# Patient Record
Sex: Male | Born: 1935 | Race: White | Hispanic: No | Marital: Married | Smoking: Former smoker
Health system: Southern US, Community
[De-identification: ages and names within clinical notes are randomized; demographics above are authoritative.]

## PROBLEM LIST (undated history)

## (undated) DIAGNOSIS — F068 Other specified mental disorders due to known physiological condition: Secondary | ICD-10-CM

## (undated) DIAGNOSIS — R296 Repeated falls: Secondary | ICD-10-CM

## (undated) DIAGNOSIS — G309 Alzheimer's disease, unspecified: Secondary | ICD-10-CM

## (undated) DIAGNOSIS — Z87448 Personal history of other diseases of urinary system: Secondary | ICD-10-CM

## (undated) DIAGNOSIS — J449 Chronic obstructive pulmonary disease, unspecified: Secondary | ICD-10-CM

## (undated) DIAGNOSIS — J40 Bronchitis, not specified as acute or chronic: Secondary | ICD-10-CM

## (undated) DIAGNOSIS — F329 Major depressive disorder, single episode, unspecified: Secondary | ICD-10-CM

## (undated) DIAGNOSIS — F1021 Alcohol dependence, in remission: Secondary | ICD-10-CM

## (undated) DIAGNOSIS — I129 Hypertensive chronic kidney disease with stage 1 through stage 4 chronic kidney disease, or unspecified chronic kidney disease: Secondary | ICD-10-CM

## (undated) DIAGNOSIS — I10 Essential (primary) hypertension: Secondary | ICD-10-CM

## (undated) DIAGNOSIS — E785 Hyperlipidemia, unspecified: Secondary | ICD-10-CM

## (undated) DIAGNOSIS — F17200 Nicotine dependence, unspecified, uncomplicated: Secondary | ICD-10-CM

## (undated) DIAGNOSIS — K469 Unspecified abdominal hernia without obstruction or gangrene: Secondary | ICD-10-CM

## (undated) DIAGNOSIS — F0280 Dementia in other diseases classified elsewhere without behavioral disturbance: Secondary | ICD-10-CM

## (undated) DIAGNOSIS — M545 Low back pain: Secondary | ICD-10-CM

## (undated) HISTORY — DX: Bronchitis, not specified as acute or chronic: J40

## (undated) HISTORY — DX: Major depressive disorder, single episode, unspecified: F32.9

## (undated) HISTORY — DX: Dementia in other diseases classified elsewhere without behavioral disturbance: F02.80

## (undated) HISTORY — DX: Alcohol dependence, in remission: F10.21

## (undated) HISTORY — DX: Hypertensive chronic kidney disease with stage 1 through stage 4 chronic kidney disease, or unspecified chronic kidney disease: I12.9

## (undated) HISTORY — PX: PILONIDAL CYST EXCISION: SHX744

## (undated) HISTORY — DX: Other specified mental disorders due to known physiological condition: F06.8

## (undated) HISTORY — DX: Personal history of other diseases of urinary system: Z87.448

## (undated) HISTORY — DX: Alzheimer's disease, unspecified: G30.9

## (undated) HISTORY — DX: Hyperlipidemia, unspecified: E78.5

## (undated) HISTORY — DX: Essential (primary) hypertension: I10

## (undated) HISTORY — DX: Nicotine dependence, unspecified, uncomplicated: F17.200

## (undated) HISTORY — DX: Unspecified abdominal hernia without obstruction or gangrene: K46.9

## (undated) HISTORY — DX: Low back pain: M54.5

## (undated) HISTORY — PX: GLAUCOMA REPAIR: SHX214

## (undated) HISTORY — DX: Chronic obstructive pulmonary disease, unspecified: J44.9

---

## 1998-08-01 ENCOUNTER — Emergency Department (HOSPITAL_COMMUNITY): Admission: EM | Admit: 1998-08-01 | Discharge: 1998-08-01 | Payer: Self-pay | Admitting: Emergency Medicine

## 2001-05-01 ENCOUNTER — Encounter: Payer: Self-pay | Admitting: Internal Medicine

## 2001-05-15 ENCOUNTER — Encounter: Payer: Self-pay | Admitting: Vascular Surgery

## 2001-05-19 ENCOUNTER — Ambulatory Visit (HOSPITAL_COMMUNITY): Admission: RE | Admit: 2001-05-19 | Discharge: 2001-05-19 | Payer: Self-pay | Admitting: Vascular Surgery

## 2001-05-21 ENCOUNTER — Ambulatory Visit (HOSPITAL_COMMUNITY): Admission: RE | Admit: 2001-05-21 | Discharge: 2001-05-21 | Payer: Self-pay | Admitting: Vascular Surgery

## 2001-05-21 ENCOUNTER — Encounter: Payer: Self-pay | Admitting: Vascular Surgery

## 2001-06-11 HISTORY — PX: ABDOMINAL AORTIC ANEURYSM REPAIR: SUR1152

## 2001-07-01 ENCOUNTER — Inpatient Hospital Stay (HOSPITAL_COMMUNITY): Admission: RE | Admit: 2001-07-01 | Discharge: 2001-07-03 | Payer: Self-pay | Admitting: Vascular Surgery

## 2001-07-01 ENCOUNTER — Encounter: Payer: Self-pay | Admitting: Vascular Surgery

## 2001-08-13 ENCOUNTER — Encounter: Payer: Self-pay | Admitting: Vascular Surgery

## 2001-08-13 ENCOUNTER — Encounter: Admission: RE | Admit: 2001-08-13 | Discharge: 2001-08-13 | Payer: Self-pay | Admitting: Vascular Surgery

## 2001-11-12 ENCOUNTER — Encounter: Payer: Self-pay | Admitting: Vascular Surgery

## 2001-11-12 ENCOUNTER — Encounter: Admission: RE | Admit: 2001-11-12 | Discharge: 2001-11-12 | Payer: Self-pay | Admitting: Vascular Surgery

## 2002-05-21 ENCOUNTER — Encounter: Payer: Self-pay | Admitting: Vascular Surgery

## 2002-05-21 ENCOUNTER — Encounter: Admission: RE | Admit: 2002-05-21 | Discharge: 2002-05-21 | Payer: Self-pay | Admitting: Vascular Surgery

## 2003-06-23 ENCOUNTER — Encounter: Admission: RE | Admit: 2003-06-23 | Discharge: 2003-06-23 | Payer: Self-pay | Admitting: Vascular Surgery

## 2003-09-01 ENCOUNTER — Encounter: Payer: Self-pay | Admitting: Internal Medicine

## 2004-07-12 ENCOUNTER — Encounter: Admission: RE | Admit: 2004-07-12 | Discharge: 2004-07-12 | Payer: Self-pay | Admitting: Vascular Surgery

## 2004-07-27 ENCOUNTER — Ambulatory Visit: Payer: Self-pay | Admitting: Internal Medicine

## 2004-08-03 ENCOUNTER — Ambulatory Visit: Payer: Self-pay | Admitting: Internal Medicine

## 2004-10-11 ENCOUNTER — Ambulatory Visit: Payer: Self-pay | Admitting: Gastroenterology

## 2004-11-30 ENCOUNTER — Ambulatory Visit: Payer: Self-pay | Admitting: Gastroenterology

## 2005-02-07 ENCOUNTER — Encounter: Admission: RE | Admit: 2005-02-07 | Discharge: 2005-02-07 | Payer: Self-pay | Admitting: Vascular Surgery

## 2005-05-22 ENCOUNTER — Ambulatory Visit: Payer: Self-pay | Admitting: Internal Medicine

## 2005-06-11 HISTORY — PX: OTHER SURGICAL HISTORY: SHX169

## 2005-08-22 ENCOUNTER — Encounter: Admission: RE | Admit: 2005-08-22 | Discharge: 2005-08-22 | Payer: Self-pay | Admitting: Vascular Surgery

## 2005-09-04 ENCOUNTER — Emergency Department (HOSPITAL_COMMUNITY): Admission: EM | Admit: 2005-09-04 | Discharge: 2005-09-04 | Payer: Self-pay | Admitting: Emergency Medicine

## 2005-09-05 ENCOUNTER — Ambulatory Visit: Payer: Self-pay | Admitting: Internal Medicine

## 2005-09-06 ENCOUNTER — Ambulatory Visit: Payer: Self-pay

## 2005-09-06 ENCOUNTER — Encounter: Payer: Self-pay | Admitting: Internal Medicine

## 2005-09-19 ENCOUNTER — Inpatient Hospital Stay (HOSPITAL_COMMUNITY): Admission: AD | Admit: 2005-09-19 | Discharge: 2005-09-22 | Payer: Self-pay | Admitting: Internal Medicine

## 2005-09-19 ENCOUNTER — Ambulatory Visit: Payer: Self-pay | Admitting: Internal Medicine

## 2005-10-02 ENCOUNTER — Inpatient Hospital Stay (HOSPITAL_COMMUNITY): Admission: RE | Admit: 2005-10-02 | Discharge: 2005-10-10 | Payer: Self-pay | Admitting: Vascular Surgery

## 2005-10-02 ENCOUNTER — Encounter (INDEPENDENT_AMBULATORY_CARE_PROVIDER_SITE_OTHER): Payer: Self-pay | Admitting: *Deleted

## 2005-10-13 ENCOUNTER — Emergency Department (HOSPITAL_COMMUNITY): Admission: EM | Admit: 2005-10-13 | Discharge: 2005-10-13 | Payer: Self-pay | Admitting: Emergency Medicine

## 2005-10-16 ENCOUNTER — Ambulatory Visit: Payer: Self-pay | Admitting: Internal Medicine

## 2005-11-28 ENCOUNTER — Ambulatory Visit: Payer: Self-pay | Admitting: Internal Medicine

## 2006-01-08 ENCOUNTER — Ambulatory Visit: Payer: Self-pay | Admitting: Internal Medicine

## 2006-01-25 ENCOUNTER — Ambulatory Visit: Payer: Self-pay | Admitting: Internal Medicine

## 2006-01-25 ENCOUNTER — Emergency Department (HOSPITAL_COMMUNITY): Admission: EM | Admit: 2006-01-25 | Discharge: 2006-01-26 | Payer: Self-pay | Admitting: Emergency Medicine

## 2006-01-26 ENCOUNTER — Inpatient Hospital Stay (HOSPITAL_COMMUNITY): Admission: EM | Admit: 2006-01-26 | Discharge: 2006-02-02 | Payer: Self-pay | Admitting: *Deleted

## 2006-01-26 ENCOUNTER — Ambulatory Visit: Payer: Self-pay | Admitting: Psychiatry

## 2006-02-04 ENCOUNTER — Ambulatory Visit: Payer: Self-pay | Admitting: Internal Medicine

## 2006-02-14 ENCOUNTER — Ambulatory Visit: Payer: Self-pay | Admitting: Internal Medicine

## 2006-03-06 ENCOUNTER — Encounter: Payer: Self-pay | Admitting: Internal Medicine

## 2006-05-14 ENCOUNTER — Inpatient Hospital Stay (HOSPITAL_COMMUNITY): Admission: AD | Admit: 2006-05-14 | Discharge: 2006-05-21 | Payer: Self-pay | Admitting: Psychiatry

## 2006-05-14 ENCOUNTER — Emergency Department (HOSPITAL_COMMUNITY): Admission: EM | Admit: 2006-05-14 | Discharge: 2006-05-14 | Payer: Self-pay | Admitting: Emergency Medicine

## 2006-05-14 ENCOUNTER — Encounter: Payer: Self-pay | Admitting: Internal Medicine

## 2006-05-14 ENCOUNTER — Ambulatory Visit: Payer: Self-pay | Admitting: Psychiatry

## 2006-05-30 ENCOUNTER — Ambulatory Visit: Payer: Self-pay | Admitting: Internal Medicine

## 2006-05-30 LAB — CONVERTED CEMR LAB
ALT: 38 units/L (ref 0–40)
AST: 22 units/L (ref 0–37)
Alkaline Phosphatase: 75 units/L (ref 39–117)
Total Bilirubin: 0.5 mg/dL (ref 0.3–1.2)
Total Protein: 6.6 g/dL (ref 6.0–8.3)

## 2006-07-29 ENCOUNTER — Ambulatory Visit: Payer: Self-pay | Admitting: Internal Medicine

## 2006-09-17 IMAGING — CR DG CHEST 2V
2 series · 2 of 2 positions shown · non-contrast
Comparison: none

CLINICAL DATA: Prestent placement. 
 CHEST - 2 VIEW ? 09/19/05:
 Two views of the chest show the lungs to be clear but hyperaerated suggesting COPD.  The heart is within normal limits in size.  No acute bony abnormality is seen.

[view not recorded (1 of 2)]
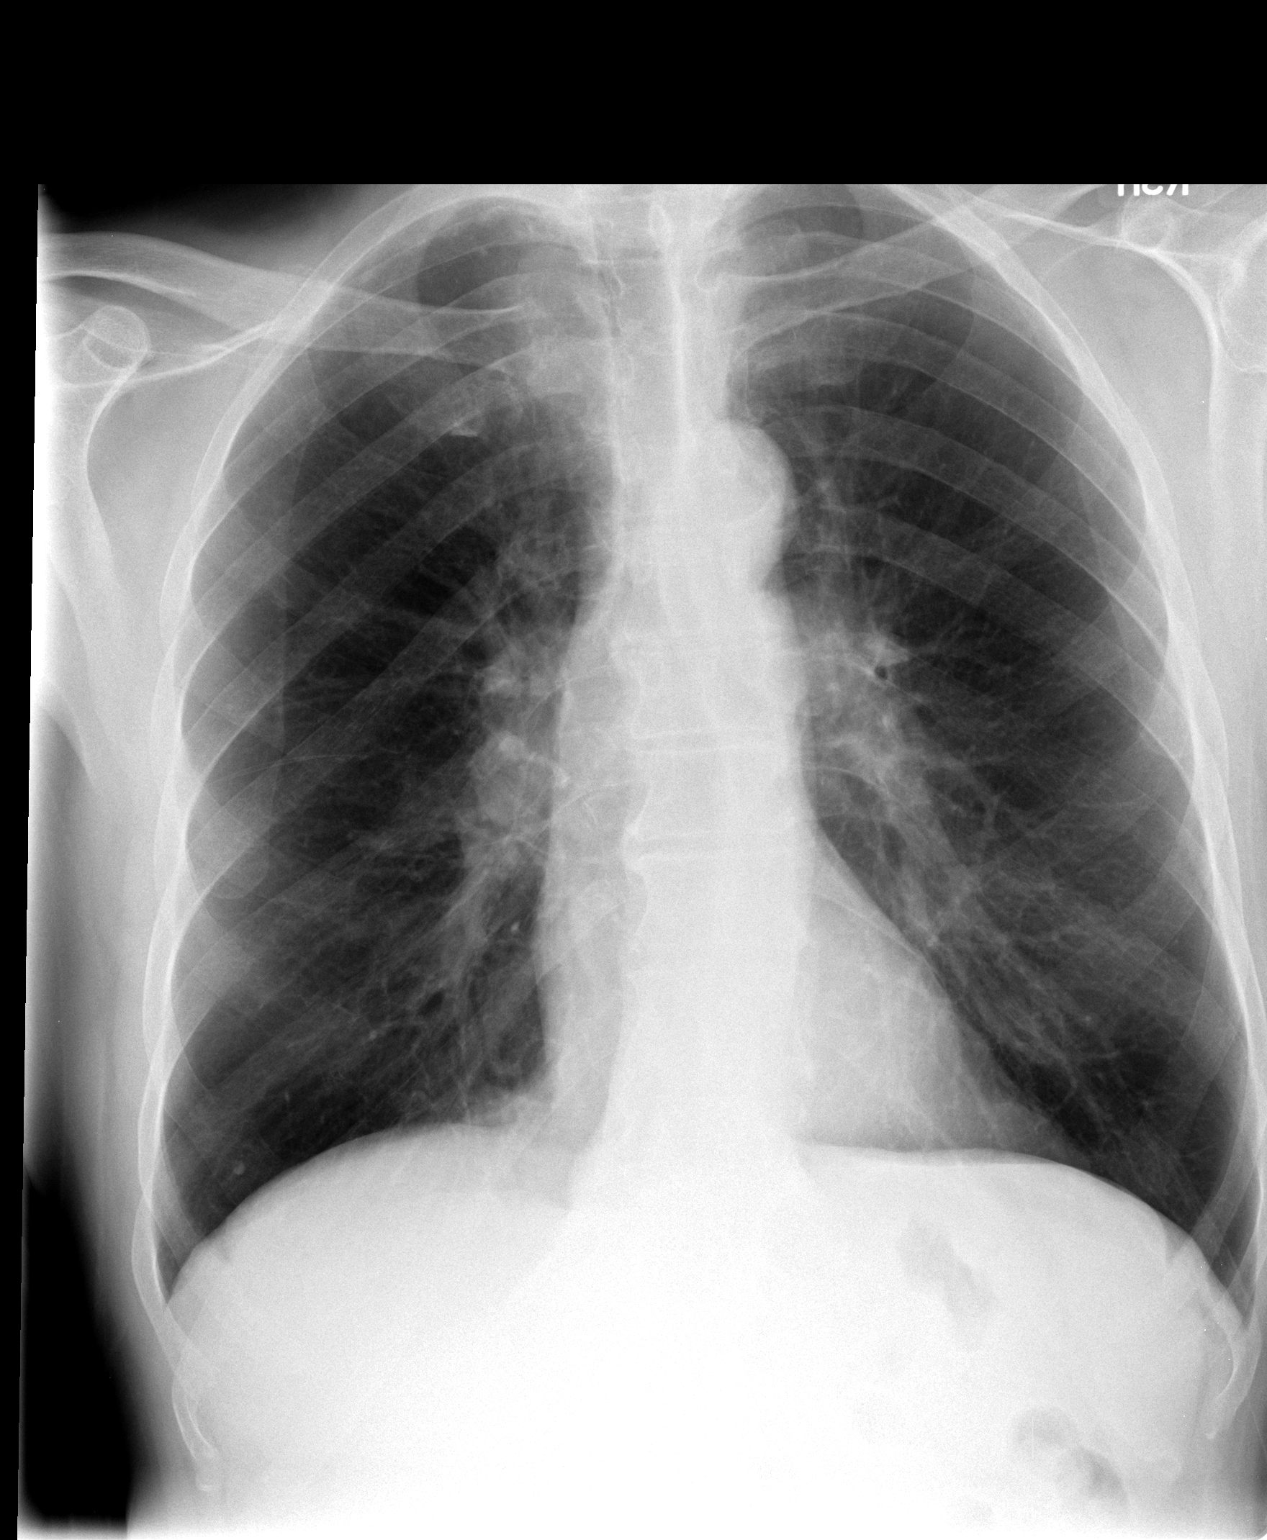

[view not recorded (2 of 2)]
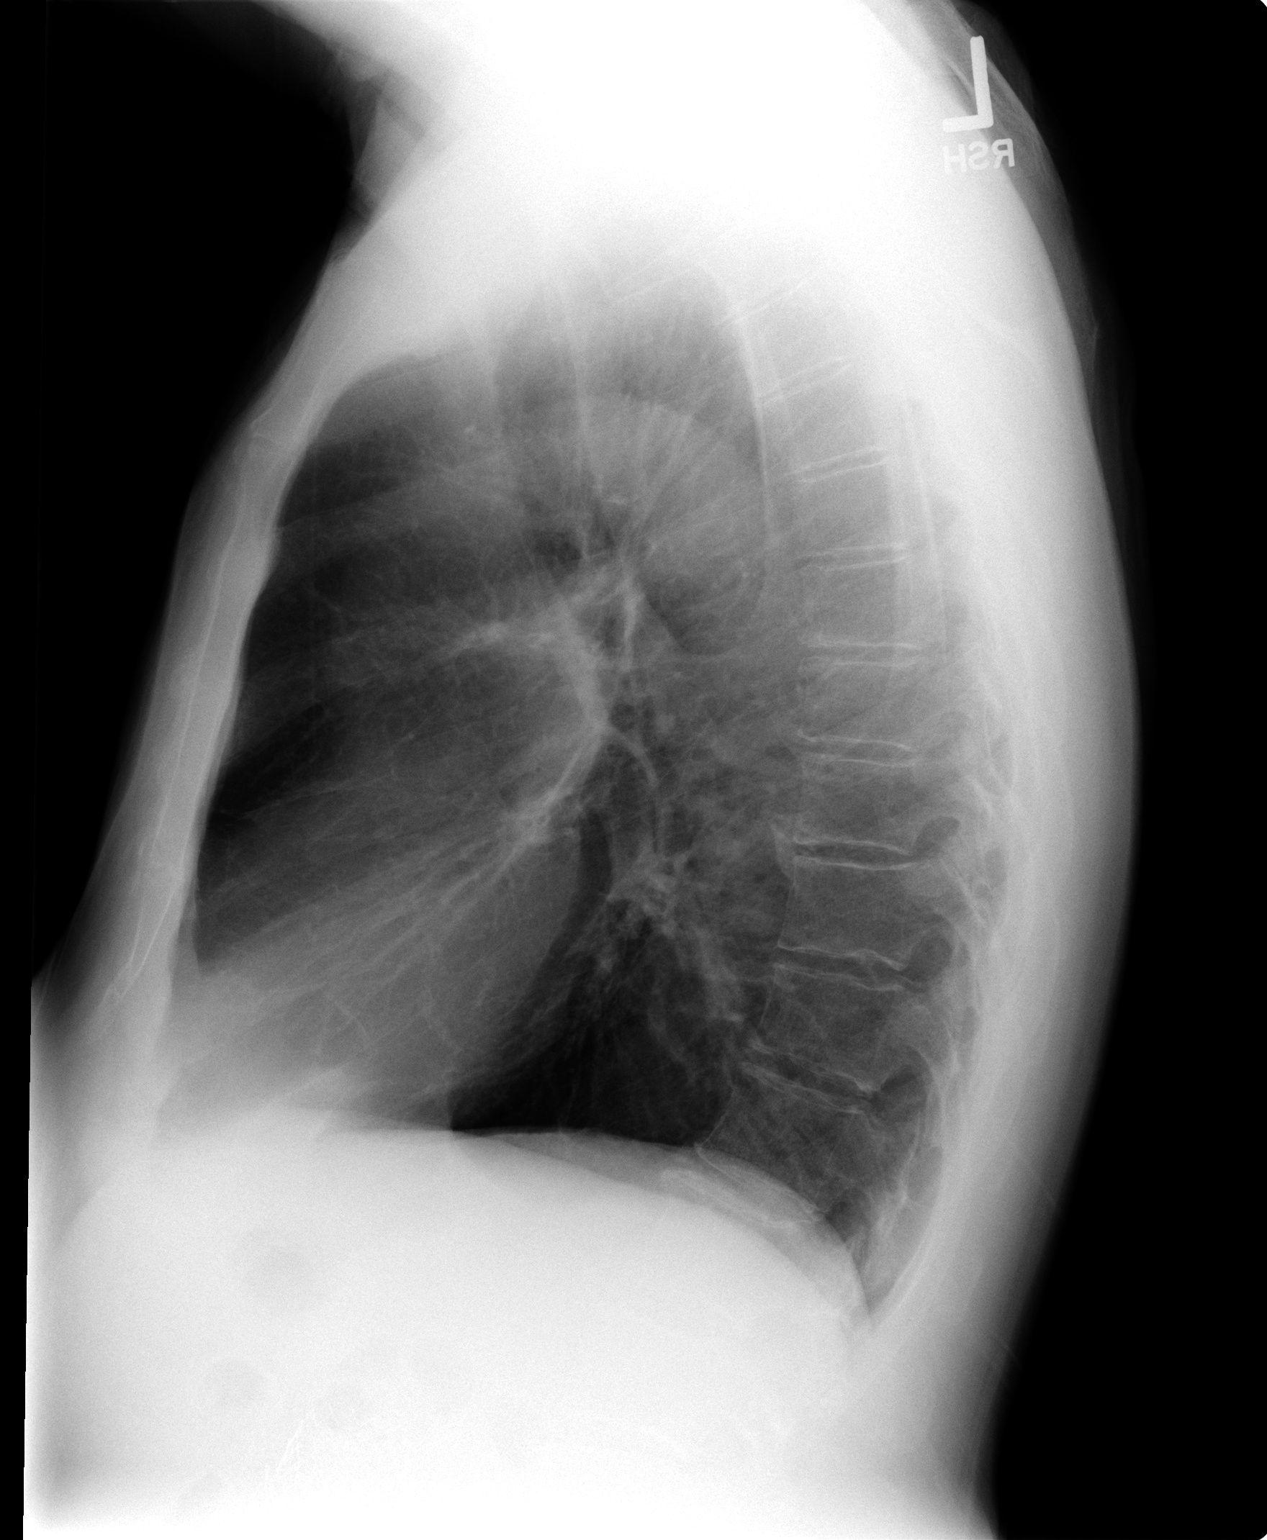

[2 of 2 positions shown; findings below may reference images not displayed]

IMPRESSION: No active lung disease. Probable COPD.

## 2006-09-30 IMAGING — CR DG CHEST 1V PORT
1 series · 1 of 1 positions shown · non-contrast
Comparison: none

CLINICAL DATA: Status post abdominal aortic aneurysm repair.  Swan-Ganz catheter line placement.  
 PORTABLE CHEST -  SINGLE VIEW - 10/02/05:

[view not recorded]
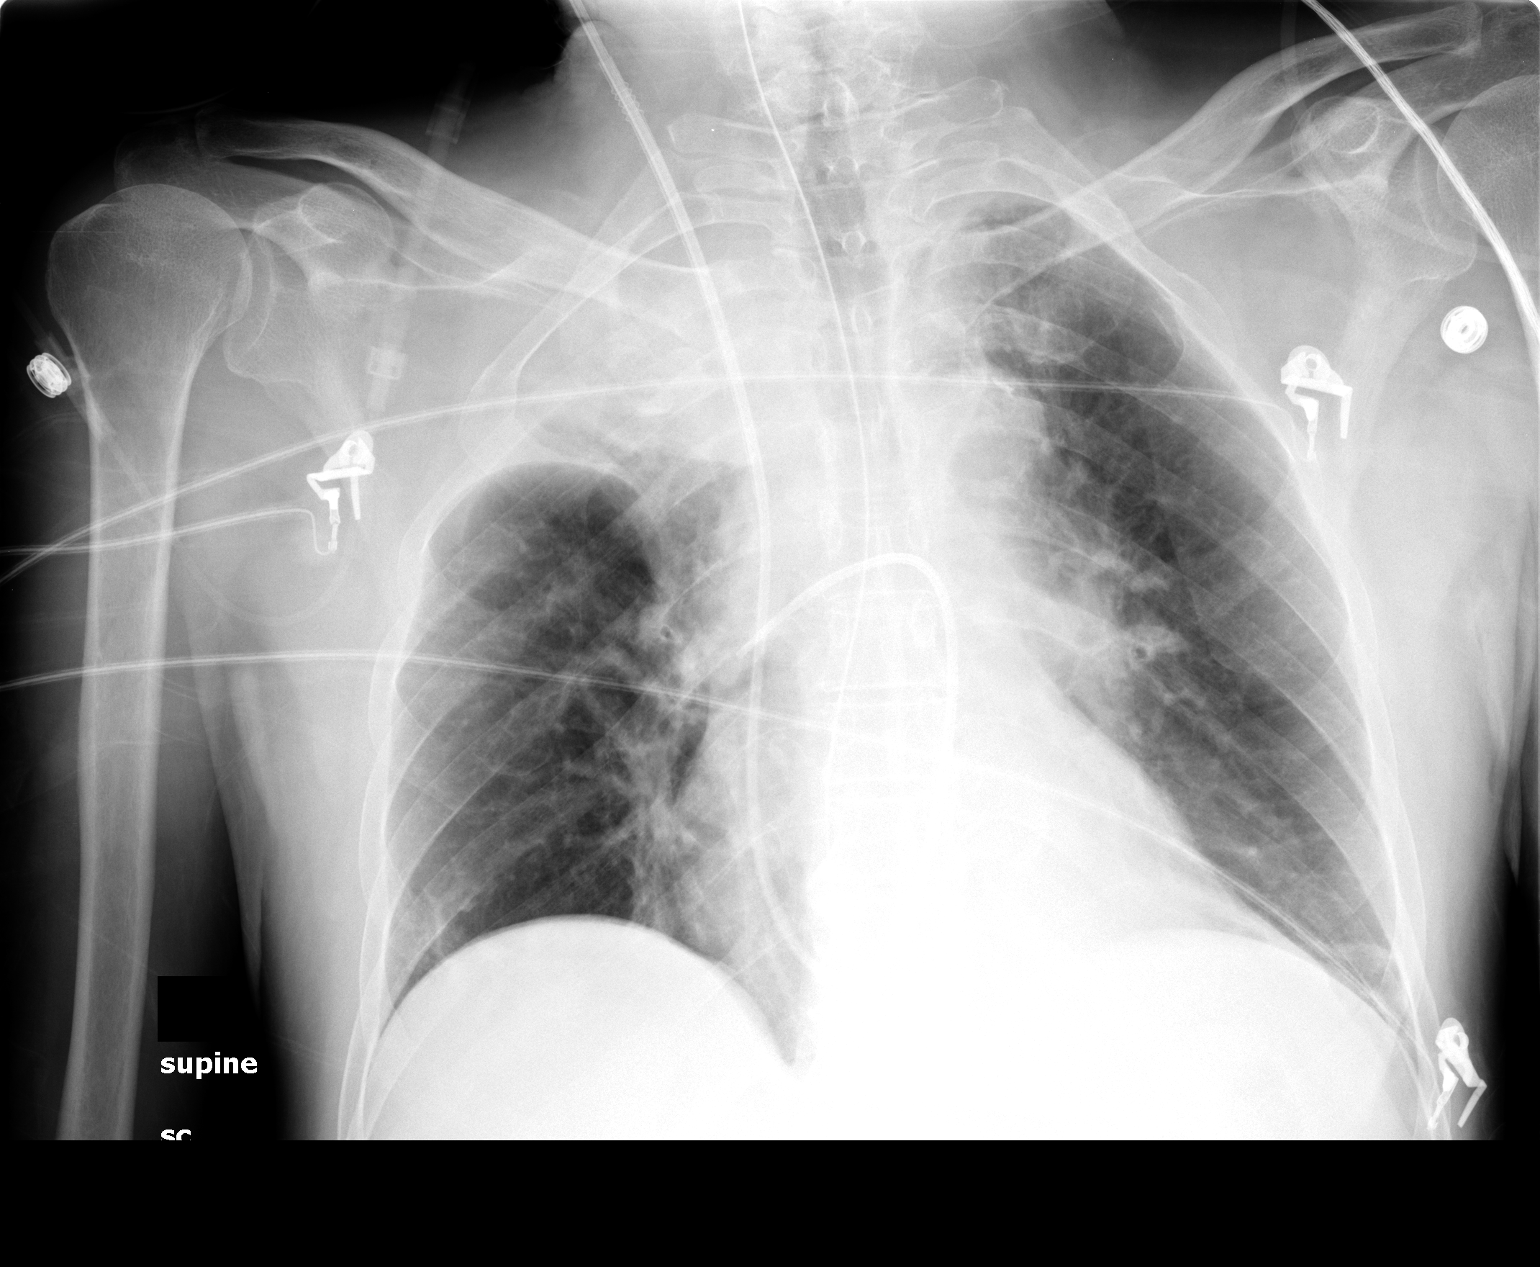

[1 of 1 positions shown; findings below may reference images not displayed]

FINDINGS: An AP supine portable film of the chest made on 10/02/05 at 2575 hours is compared with the preoperative study of 09/19/05 and now shows a right jugular Swan-Ganz catheter to have been inserted.  The catheter appears to be in good position with the tip in the right main pulmonary artery at the junction of the descending right pulmonary artery.  There is no pneumothorax but there does appear to be areas of atelectasis and possibly hemorrhage associated with the right lung apex.  No free fluid is seen in the pleural space.
 The nasogastric tube tip is in the fundus of the stomach.  There is some hypoaeration of the left base.
IMPRESSION: Right Swan-Ganz catheter tip is in the right main pulmonary artery near the bifurcation into the upper and lower right lung pulmonary arteries.
 There is no definite pneumothorax but there is suggestion of atelectasis and/or hemorrhage associated with the region of the right lung apex.
 The nasogastric tube tip is in the fundus of the stomach.

## 2006-10-01 IMAGING — CR DG CHEST 1V PORT
1 series · 1 of 1 positions shown · non-contrast
Comparison: 10/02/2005.

CLINICAL DATA: Abdominal aortic aneurysm.
 PORTABLE CHEST, ONE VIEW ? 10/03/2005:

[view not recorded]
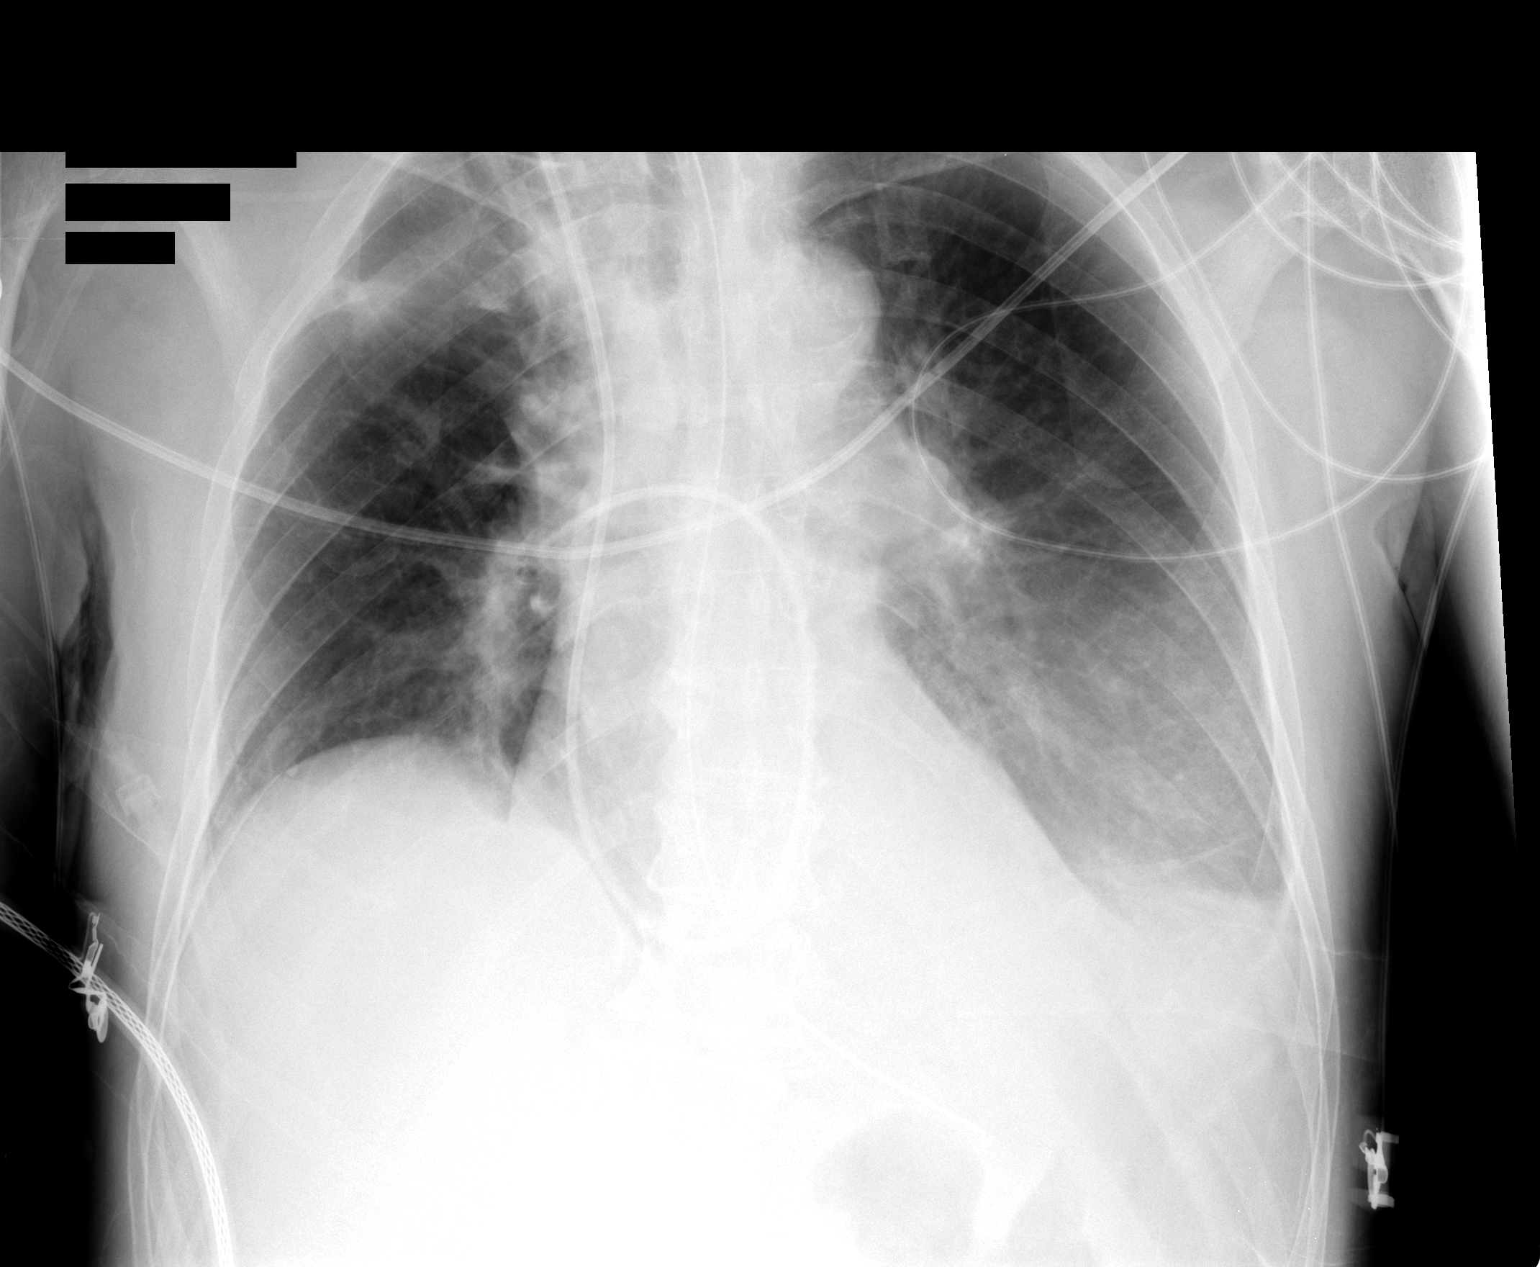

[1 of 1 positions shown; findings below may reference images not displayed]

FINDINGS: There is an NG tube with side-port at the level of the GE junction.
 There is a new left effusion which is layering posteriorly.  Aeration to the right upper lobe is improved in the interval.
 Swan-Ganz catheter tip is within the right pulmonary artery.
IMPRESSION: 1. Improved aeration to the right upper lobe.
 2. New left effusion layering posteriorly.
 3. Stable nasogastric tube and Swan-Ganz catheter.

## 2006-10-11 IMAGING — CR DG CHEST 1V PORT
1 series · 1 of 1 positions shown · non-contrast
Comparison: none

CLINICAL DATA: Short of breath

Portable chest at 8377:
Comparison 10/04/2005. Small left effusion. Patchy left infrahilar and
retrocardiac atelectasis or infiltrate, improved since previous exam. Right lung
clear. Heart size normal. Atheromatous aortic arch.

[view not recorded]
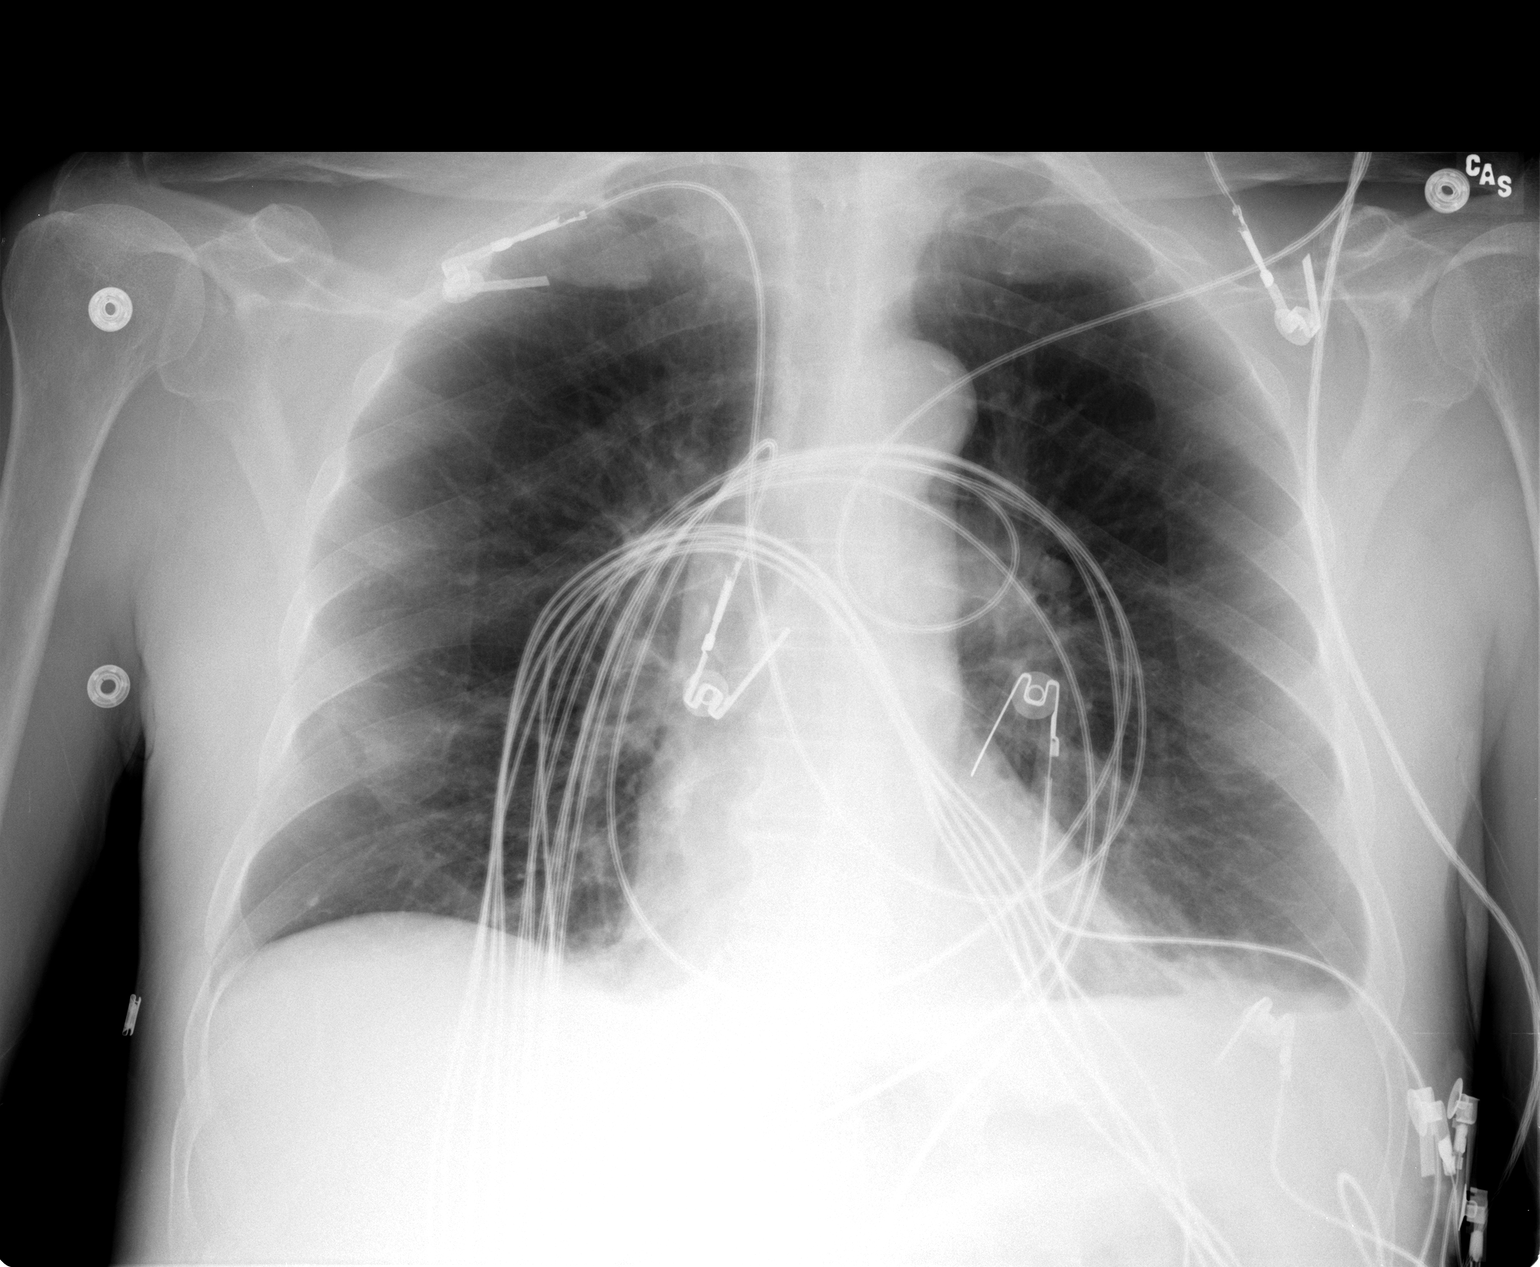

[1 of 1 positions shown; findings below may reference images not displayed]

IMPRESSION: 1. Small left effusion with left lower lung infiltrate or atelectasis, improved
since previous exam

## 2006-10-28 ENCOUNTER — Ambulatory Visit: Payer: Self-pay | Admitting: Internal Medicine

## 2006-11-05 ENCOUNTER — Encounter: Payer: Self-pay | Admitting: Internal Medicine

## 2006-11-05 DIAGNOSIS — F1021 Alcohol dependence, in remission: Secondary | ICD-10-CM

## 2006-11-05 DIAGNOSIS — J449 Chronic obstructive pulmonary disease, unspecified: Secondary | ICD-10-CM

## 2006-11-05 DIAGNOSIS — I10 Essential (primary) hypertension: Secondary | ICD-10-CM

## 2006-11-05 DIAGNOSIS — M545 Low back pain, unspecified: Secondary | ICD-10-CM

## 2006-11-05 DIAGNOSIS — F329 Major depressive disorder, single episode, unspecified: Secondary | ICD-10-CM

## 2006-11-05 DIAGNOSIS — F3289 Other specified depressive episodes: Secondary | ICD-10-CM | POA: Insufficient documentation

## 2006-11-05 DIAGNOSIS — J4489 Other specified chronic obstructive pulmonary disease: Secondary | ICD-10-CM | POA: Insufficient documentation

## 2006-11-05 HISTORY — DX: Major depressive disorder, single episode, unspecified: F32.9

## 2006-11-05 HISTORY — DX: Essential (primary) hypertension: I10

## 2006-11-05 HISTORY — DX: Low back pain, unspecified: M54.50

## 2006-11-05 HISTORY — DX: Other specified depressive episodes: F32.89

## 2006-11-05 HISTORY — DX: Chronic obstructive pulmonary disease, unspecified: J44.9

## 2006-11-05 HISTORY — DX: Alcohol dependence, in remission: F10.21

## 2007-01-09 ENCOUNTER — Telehealth: Payer: Self-pay | Admitting: Internal Medicine

## 2007-01-27 ENCOUNTER — Ambulatory Visit: Payer: Self-pay | Admitting: Internal Medicine

## 2007-01-28 ENCOUNTER — Telehealth: Payer: Self-pay | Admitting: Internal Medicine

## 2007-01-29 ENCOUNTER — Encounter: Payer: Self-pay | Admitting: Internal Medicine

## 2007-03-26 ENCOUNTER — Ambulatory Visit: Payer: Self-pay | Admitting: Internal Medicine

## 2007-03-26 LAB — CONVERTED CEMR LAB
Albumin: 4 g/dL (ref 3.5–5.2)
Alkaline Phosphatase: 83 units/L (ref 39–117)
BUN: 11 mg/dL (ref 6–23)
Basophils Absolute: 0.1 10*3/uL (ref 0.0–0.1)
Calcium: 10.1 mg/dL (ref 8.4–10.5)
Creatinine, Ser: 0.9 mg/dL (ref 0.4–1.5)
GFR calc Af Amer: 107 mL/min
Hemoglobin: 16.4 g/dL (ref 13.0–17.0)
MCHC: 33.6 g/dL (ref 30.0–36.0)
Monocytes Absolute: 0.4 10*3/uL (ref 0.2–0.7)
Monocytes Relative: 7.3 % (ref 3.0–11.0)
Platelets: 258 10*3/uL (ref 150–400)
Potassium: 4.4 meq/L (ref 3.5–5.1)
RDW: 12.3 % (ref 11.5–14.6)
TSH: 1.75 microintl units/mL (ref 0.35–5.50)
Total Bilirubin: 0.7 mg/dL (ref 0.3–1.2)

## 2007-04-15 ENCOUNTER — Telehealth (INDEPENDENT_AMBULATORY_CARE_PROVIDER_SITE_OTHER): Payer: Self-pay | Admitting: *Deleted

## 2007-04-29 ENCOUNTER — Ambulatory Visit: Payer: Self-pay | Admitting: Gastroenterology

## 2007-05-14 ENCOUNTER — Encounter: Payer: Self-pay | Admitting: Gastroenterology

## 2007-05-14 ENCOUNTER — Ambulatory Visit: Payer: Self-pay | Admitting: Gastroenterology

## 2007-06-17 ENCOUNTER — Ambulatory Visit: Payer: Self-pay | Admitting: Gastroenterology

## 2007-06-17 LAB — CONVERTED CEMR LAB
A-1 Antitrypsin, Ser: 189 mg/dL (ref 83–200)
Folate: >20 ng/mL
HCV Ab: NEGATIVE
INR: 1.1 — ABNORMAL HIGH (ref 0.8–1.0)
Vitamin B-12: 303 pg/mL (ref 211–911)

## 2007-06-19 ENCOUNTER — Ambulatory Visit: Payer: Self-pay | Admitting: Internal Medicine

## 2007-06-24 ENCOUNTER — Telehealth: Payer: Self-pay | Admitting: Internal Medicine

## 2007-07-16 ENCOUNTER — Telehealth: Payer: Self-pay | Admitting: Internal Medicine

## 2007-09-26 ENCOUNTER — Ambulatory Visit: Payer: Self-pay | Admitting: Internal Medicine

## 2007-10-06 ENCOUNTER — Telehealth: Payer: Self-pay | Admitting: Internal Medicine

## 2007-10-17 ENCOUNTER — Telehealth: Payer: Self-pay | Admitting: Internal Medicine

## 2007-10-22 ENCOUNTER — Ambulatory Visit: Payer: Self-pay | Admitting: Internal Medicine

## 2007-10-22 DIAGNOSIS — K469 Unspecified abdominal hernia without obstruction or gangrene: Secondary | ICD-10-CM | POA: Insufficient documentation

## 2007-10-22 HISTORY — DX: Unspecified abdominal hernia without obstruction or gangrene: K46.9

## 2007-10-24 ENCOUNTER — Telehealth: Payer: Self-pay | Admitting: Internal Medicine

## 2007-10-29 ENCOUNTER — Ambulatory Visit: Payer: Self-pay | Admitting: Internal Medicine

## 2007-10-30 ENCOUNTER — Telehealth: Payer: Self-pay | Admitting: Internal Medicine

## 2007-10-31 ENCOUNTER — Telehealth: Payer: Self-pay | Admitting: Internal Medicine

## 2007-10-31 ENCOUNTER — Encounter: Payer: Self-pay | Admitting: Internal Medicine

## 2007-11-24 ENCOUNTER — Encounter: Payer: Self-pay | Admitting: Internal Medicine

## 2008-01-29 ENCOUNTER — Inpatient Hospital Stay (HOSPITAL_COMMUNITY): Admission: RE | Admit: 2008-01-29 | Discharge: 2008-01-31 | Payer: Self-pay | Admitting: Surgery

## 2008-02-17 ENCOUNTER — Encounter: Payer: Self-pay | Admitting: Internal Medicine

## 2008-05-07 ENCOUNTER — Telehealth: Payer: Self-pay | Admitting: Internal Medicine

## 2008-05-27 ENCOUNTER — Telehealth: Payer: Self-pay | Admitting: Internal Medicine

## 2008-06-14 ENCOUNTER — Telehealth: Payer: Self-pay | Admitting: Internal Medicine

## 2008-06-17 ENCOUNTER — Ambulatory Visit: Payer: Self-pay | Admitting: Internal Medicine

## 2008-06-17 DIAGNOSIS — F17200 Nicotine dependence, unspecified, uncomplicated: Secondary | ICD-10-CM

## 2008-06-17 DIAGNOSIS — E785 Hyperlipidemia, unspecified: Secondary | ICD-10-CM

## 2008-06-17 HISTORY — DX: Hyperlipidemia, unspecified: E78.5

## 2008-06-17 HISTORY — DX: Nicotine dependence, unspecified, uncomplicated: F17.200

## 2008-07-05 ENCOUNTER — Encounter: Payer: Self-pay | Admitting: Internal Medicine

## 2008-07-29 ENCOUNTER — Ambulatory Visit: Payer: Self-pay | Admitting: Internal Medicine

## 2008-07-29 DIAGNOSIS — Z87448 Personal history of other diseases of urinary system: Secondary | ICD-10-CM

## 2008-07-29 HISTORY — DX: Personal history of other diseases of urinary system: Z87.448

## 2008-08-24 ENCOUNTER — Ambulatory Visit: Payer: Self-pay | Admitting: Internal Medicine

## 2008-08-24 DIAGNOSIS — N390 Urinary tract infection, site not specified: Secondary | ICD-10-CM | POA: Insufficient documentation

## 2008-08-24 LAB — CONVERTED CEMR LAB
Bilirubin Urine: NEGATIVE
Glucose, Urine, Semiquant: NEGATIVE
pH: 5

## 2008-09-10 ENCOUNTER — Encounter: Payer: Self-pay | Admitting: Internal Medicine

## 2008-09-20 ENCOUNTER — Telehealth: Payer: Self-pay | Admitting: Internal Medicine

## 2008-11-05 ENCOUNTER — Encounter: Payer: Self-pay | Admitting: Internal Medicine

## 2008-12-27 ENCOUNTER — Ambulatory Visit: Payer: Self-pay | Admitting: Internal Medicine

## 2008-12-27 DIAGNOSIS — I129 Hypertensive chronic kidney disease with stage 1 through stage 4 chronic kidney disease, or unspecified chronic kidney disease: Secondary | ICD-10-CM

## 2008-12-27 DIAGNOSIS — H60399 Other infective otitis externa, unspecified ear: Secondary | ICD-10-CM | POA: Insufficient documentation

## 2008-12-27 HISTORY — DX: Hypertensive chronic kidney disease with stage 1 through stage 4 chronic kidney disease, or unspecified chronic kidney disease: I12.9

## 2008-12-30 ENCOUNTER — Encounter: Payer: Self-pay | Admitting: Internal Medicine

## 2008-12-30 ENCOUNTER — Ambulatory Visit: Payer: Self-pay | Admitting: Vascular Surgery

## 2009-01-11 ENCOUNTER — Ambulatory Visit: Payer: Self-pay | Admitting: Vascular Surgery

## 2009-02-15 ENCOUNTER — Encounter: Payer: Self-pay | Admitting: Internal Medicine

## 2009-02-16 ENCOUNTER — Ambulatory Visit: Payer: Self-pay | Admitting: Internal Medicine

## 2009-02-16 LAB — CONVERTED CEMR LAB
ALT: 20 units/L (ref 0–53)
AST: 21 units/L (ref 0–37)
Albumin: 3.8 g/dL (ref 3.5–5.2)
BUN: 15 mg/dL (ref 6–23)
Chloride: 102 meq/L (ref 96–112)
Cholesterol: 195 mg/dL (ref 0–200)
Eosinophils Relative: 7.2 % — ABNORMAL HIGH (ref 0.0–5.0)
Glucose, Bld: 103 mg/dL — ABNORMAL HIGH (ref 70–99)
HCT: 42.2 % (ref 39.0–52.0)
LDL Cholesterol: 129 mg/dL — ABNORMAL HIGH (ref 0–99)
Lymphs Abs: 1.4 10*3/uL (ref 0.7–4.0)
MCV: 88.8 fL (ref 78.0–100.0)
Monocytes Absolute: 0.4 10*3/uL (ref 0.1–1.0)
PSA: 2.75 ng/mL (ref 0.10–4.00)
Platelets: 267 10*3/uL (ref 150.0–400.0)
Potassium: 4.7 meq/L (ref 3.5–5.1)
TSH: 1.98 microintl units/mL (ref 0.35–5.50)
Total Protein: 7.3 g/dL (ref 6.0–8.3)
WBC: 6 10*3/uL (ref 4.5–10.5)

## 2009-04-05 ENCOUNTER — Ambulatory Visit: Payer: Self-pay | Admitting: Internal Medicine

## 2009-04-05 DIAGNOSIS — L723 Sebaceous cyst: Secondary | ICD-10-CM

## 2009-06-11 DIAGNOSIS — F0280 Dementia in other diseases classified elsewhere without behavioral disturbance: Secondary | ICD-10-CM

## 2009-06-11 HISTORY — DX: Dementia in other diseases classified elsewhere, unspecified severity, without behavioral disturbance, psychotic disturbance, mood disturbance, and anxiety: F02.80

## 2009-06-22 ENCOUNTER — Telehealth: Payer: Self-pay | Admitting: Internal Medicine

## 2009-07-07 ENCOUNTER — Ambulatory Visit: Payer: Self-pay | Admitting: Internal Medicine

## 2009-07-07 ENCOUNTER — Telehealth: Payer: Self-pay | Admitting: Internal Medicine

## 2009-08-10 ENCOUNTER — Telehealth: Payer: Self-pay | Admitting: Internal Medicine

## 2009-10-17 ENCOUNTER — Ambulatory Visit: Payer: Self-pay | Admitting: Internal Medicine

## 2009-10-17 DIAGNOSIS — M549 Dorsalgia, unspecified: Secondary | ICD-10-CM | POA: Insufficient documentation

## 2009-12-15 ENCOUNTER — Telehealth: Payer: Self-pay

## 2009-12-19 ENCOUNTER — Ambulatory Visit: Payer: Self-pay | Admitting: Internal Medicine

## 2009-12-19 DIAGNOSIS — F0391 Unspecified dementia with behavioral disturbance: Secondary | ICD-10-CM

## 2009-12-19 DIAGNOSIS — F068 Other specified mental disorders due to known physiological condition: Secondary | ICD-10-CM

## 2009-12-19 HISTORY — DX: Other specified mental disorders due to known physiological condition: F06.8

## 2009-12-26 ENCOUNTER — Telehealth: Payer: Self-pay | Admitting: Internal Medicine

## 2010-01-02 ENCOUNTER — Telehealth: Payer: Self-pay | Admitting: Internal Medicine

## 2010-02-02 ENCOUNTER — Ambulatory Visit: Payer: Self-pay | Admitting: Vascular Surgery

## 2010-02-17 ENCOUNTER — Ambulatory Visit: Payer: Self-pay | Admitting: Internal Medicine

## 2010-03-01 ENCOUNTER — Emergency Department (HOSPITAL_COMMUNITY)
Admission: EM | Admit: 2010-03-01 | Discharge: 2010-03-03 | Disposition: A | Discharge status: Other Acute Inpatient Hospital | Payer: Self-pay | Source: Home / Self Care | Admitting: Emergency Medicine

## 2010-03-21 ENCOUNTER — Ambulatory Visit: Payer: Self-pay | Admitting: Internal Medicine

## 2010-03-28 ENCOUNTER — Telehealth: Payer: Self-pay | Admitting: Internal Medicine

## 2010-03-30 ENCOUNTER — Telehealth: Payer: Self-pay | Admitting: Internal Medicine

## 2010-04-03 ENCOUNTER — Ambulatory Visit: Payer: Self-pay | Admitting: Internal Medicine

## 2010-04-05 ENCOUNTER — Encounter: Payer: Self-pay | Admitting: Internal Medicine

## 2010-04-05 ENCOUNTER — Telehealth: Payer: Self-pay | Admitting: Internal Medicine

## 2010-04-14 ENCOUNTER — Telehealth: Payer: Self-pay | Admitting: Internal Medicine

## 2010-04-27 ENCOUNTER — Telehealth: Payer: Self-pay | Admitting: Internal Medicine

## 2010-04-28 ENCOUNTER — Encounter: Payer: Self-pay | Admitting: Internal Medicine

## 2010-04-28 ENCOUNTER — Telehealth: Payer: Self-pay

## 2010-05-01 ENCOUNTER — Ambulatory Visit: Payer: Self-pay | Admitting: Internal Medicine

## 2010-05-03 ENCOUNTER — Ambulatory Visit: Payer: Self-pay | Admitting: Internal Medicine

## 2010-05-08 ENCOUNTER — Telehealth: Payer: Self-pay | Admitting: Internal Medicine

## 2010-05-16 ENCOUNTER — Telehealth: Payer: Self-pay | Admitting: Internal Medicine

## 2010-06-22 ENCOUNTER — Ambulatory Visit: Admit: 2010-06-22 | Payer: Self-pay | Admitting: Internal Medicine

## 2010-07-01 ENCOUNTER — Encounter: Payer: Self-pay | Admitting: *Deleted

## 2010-07-09 LAB — CONVERTED CEMR LAB
Albumin: 3.6 g/dL (ref 3.5–5.2)
Basophils Absolute: 0.1 10*3/uL (ref 0.0–0.1)
Basophils Relative: 1.4 % (ref 0.0–3.0)
CO2: 31 meq/L (ref 19–32)
Chloride: 104 meq/L (ref 96–112)
Eosinophils Absolute: 0.4 10*3/uL (ref 0.0–0.7)
Glucose, Bld: 105 mg/dL — ABNORMAL HIGH (ref 70–99)
HCT: 42.4 % (ref 39.0–52.0)
HDL: 30.9 mg/dL — ABNORMAL LOW (ref >39.00)
Hemoglobin: 14.3 g/dL (ref 13.0–17.0)
Lymphs Abs: 1.5 10*3/uL (ref 0.7–4.0)
MCHC: 33.8 g/dL (ref 30.0–36.0)
MCV: 88.4 fL (ref 78.0–100.0)
Neutro Abs: 3.5 10*3/uL (ref 1.4–7.7)
Potassium: 3.7 meq/L (ref 3.5–5.1)
RDW: 14.2 % (ref 11.5–14.6)
Sodium: 142 meq/L (ref 135–145)
TSH: 3.27 microintl units/mL (ref 0.35–5.50)
Total Protein: 6.8 g/dL (ref 6.0–8.3)

## 2010-07-11 NOTE — Progress Notes (Signed)
Summary: dementia worsening  Phone Note Call from Patient   Summary of Call: Dementia is worse and has become combative, and wandering to the neighbor's houses at night.  Is taking Lorazepam, and not sure if this is making him worse. [name] [phone] (daughter)  [name] ([place]) Initial call taken by: Lynann Beaver CMA,  December 15, 2009 9:10 AM  Follow-up for Phone Call        spoke with wife - dementia worse , medications not helping, lorazopam two times a day makes him more violent.  Will see on monday - to ER if worse. KIK Follow-up by: Duard Brady LPN,  December 15, 1912 10:01 AM

## 2010-07-11 NOTE — Assessment & Plan Note (Signed)
Summary: tb skin test//ccm   Nurse Visit   Allergies: No Known Drug Allergies  Immunizations Administered:  PPD Skin Test:    Vaccine Type: PPD    Site: left forearm    Mfr: Sanofi Pasteur    Dose: 0.1 ml    Route: ID    Given by: Duard Brady LPN    Exp. Date: 04/13/2011    Lot #: [id number]    Physician counseled: yes  Orders Added: 1)  TB Skin Test [86580] 2)  Admin 1st Vaccine [phone]

## 2010-07-11 NOTE — Progress Notes (Signed)
Summary: please return call today  Phone Note Call from Patient Call back at Home Phone [phone] Call back at [phone]   Caller: Spouse-louise Call For: Gordy Savers  MD Summary of Call: spouse is putting pt in nursing home she would like to talk with nurse or doctor. Initial call taken by: Heron Sabins,  March 30, 2010 12:02 PM  Follow-up for Phone Call        called Follow-up by: Gordy Savers  MD,  March 30, 2010 12:32 PM

## 2010-07-11 NOTE — Progress Notes (Signed)
Summary: FL2 faxed  Phone Note Outgoing Call   Call placed by: Duard Brady LPN,  April 28, 2010 3:28 PM Summary of Call: attempted to call [name] r/t [facility] form is ready for pick to - ans mach . at [phone] . LMTCB , also faxed form and conformation recv'd. KIK Initial call taken by: Duard Brady LPN,  April 28, 2010 3:30 PM

## 2010-07-11 NOTE — Assessment & Plan Note (Signed)
Summary: tb reading/njr   Nurse Visit   Allergies: No Known Drug Allergies  PPD Results    Date of reading: 05/03/2010    Results: < 5mm    Interpretation: negative

## 2010-07-11 NOTE — Letter (Signed)
Summary: TB Skin Test  All     ,     Phone:   Fax:           TB Skin Test    [name]    Date TB Test Placed:  ________________  L or R forearm  TB Test Placed by:  ___________________  Lot #:  __________________        Expiration Date: _____________  Date TB Test Read:  ____________________    Result ___________MM  TB Test Read by:  _______________

## 2010-07-11 NOTE — Assessment & Plan Note (Signed)
Summary: emp--will fast//ccm   Vital Signs:  Patient profile:   77 year old male Height:      68.25 inches Weight:      174 pounds BMI:     26.36 Temp:     97.5 degrees F oral BP sitting:   120 / 80  (left arm) Cuff size:   regular  Vitals Entered By: Duard Brady LPN (February 17, 2010 8:43 AM) CC: cpx - doing well Is Patient Diabetic? No  Flu Vaccine Consent Questions     Do you have a history of severe allergic reactions to this vaccine? no    Any prior history of allergic reactions to egg and/or gelatin? no    Do you have a sensitivity to the preservative Thimersol? no    Do you have a past history of Guillan-Barre Syndrome? no    Do you currently have an acute febrile illness? no    Have you ever had a severe reaction to latex? no    Vaccine information given and explained to patient? yes    Are you currently pregnant? no    Lot Number:[id number]   Exp Date:12/09/2010   Site Given  Left Deltoid IM  CC:  cpx - doing well.  History of Present Illness: 76 year old patient who is seen today for a comprehensive evaluation.  Medical problems include hypertension, dyslipidemia, and history of COPD, and mild cognitive impairment.  He has a history of prior tobacco and alcohol abuse.  He is doing quite well without concerns or complaints.  Here for Medicare AWV:  1.   Risk factors based on Past M, S, F history: perivascular wrist factors include hypertension, and dyslipidemia.  He is status post AAA repair 2.   Physical Activities: remains active and walks regularly, but no regimented exercise program 3.   Depression/mood: no history of depression or mood disorder, history of alcoholism 4.   Hearing: mild impairment 5.   ADL's: independent in all aspects of daily living 6.   Fall Risk: low 7.   Home Safety: no problems identified 8.   Height, weight, &visual acuity:height and weight stable.  No difficulty with visual acuity 9.   Counseling: weight loss, and more regular  exercise.  Encouraged 10.   Labs ordered based on risk factors: laboratory profile, including lipid panel, and PSA will be reviewed 11.           Referral Coordination- and appropriate at this time 12.           Care Plan- will continue aggressive lifestyle modification, and risk reduction 13.            Cognitive Assessment- alert and oriented, history of alcoholism, and mild cognitive impairment.  This has been much improved since discontinuation of alcohol use   Allergies (verified): No Known Drug Allergies  Past History:  Past Medical History: Reviewed history from 12/19/2009 and no changes required. COPD Depression Hypertension Low back pain Alcoholism abdominal incisional hernia cholesterol emboli, left eye peripheral vascular occlusive disease dementia  Past Surgical History: Reviewed history from 02/16/2009 and no changes required. Abdominal aortic aneurysm repair 2003 status post ventral hernia repair in August 2009 status post surgery for pilonidal cyst  Family History: Reviewed history from 02/16/2009 and no changes required. father died at  61 of a stroke and mother died at 14 3 brothers and 4 sisters no family history of cancer  Social History: Reviewed history from 12/27/2008 and no changes required. Divorced lives with  nephew and niece  Review of Systems  The patient denies anorexia, fever, weight loss, weight gain, vision loss, decreased hearing, hoarseness, chest pain, syncope, dyspnea on exertion, peripheral edema, prolonged cough, headaches, hemoptysis, abdominal pain, melena, hematochezia, severe indigestion/heartburn, hematuria, incontinence, genital sores, muscle weakness, suspicious skin lesions, transient blindness, difficulty walking, depression, unusual weight change, abnormal bleeding, enlarged lymph nodes, angioedema, breast masses, and testicular masses.    Physical Exam  General:  overweight-appearing.  overweight-appearing.   Head:   Normocephalic and atraumatic without obvious abnormalities. No apparent alopecia or balding. Eyes:  No corneal or conjunctival inflammation noted. EOMI. Perrla. Funduscopic exam benign, without hemorrhages, exudates or papilledema. Vision grossly normal. Ears:  cerumen in both canals Nose:  External nasal examination shows no deformity or inflammation. Nasal mucosa are pink and moist without lesions or exudates. Mouth:  Oral mucosa and oropharynx without lesions or exudates. dentures in place Neck:  No deformities, masses, or tenderness noted. Chest Wall:  No deformities, masses, tenderness or gynecomastia noted. Breasts:  No masses or gynecomastia noted Lungs:  Normal respiratory effort, chest expands symmetrically. Lungs are clear to auscultation, no crackles or wheezes. Heart:  Normal rate and regular rhythm. S1 and S2 normal without gallop, murmur, click, rub or other extra sounds. Abdomen:  Bowel sounds positive,abdomen soft and non-tender without masses, organomegaly or hernias noted.  midline scar Rectal:  considerable perirectal scarring, limited the examination hematest negative stool Genitalia:  atrophic changes only Prostate:  limited exam due to rectal scarring  Msk:  No deformity or scoliosis noted of thoracic or lumbar spine.   Pulses:  distalis pedis pulses intact;  dorsalis pedis pulse is not easily palpable Extremities:  No clubbing, cyanosis, edema, or deformity noted with normal full range of motion of all joints.   Neurologic:  alert & oriented X3, strength normal in all extremities, and gait normal.  alert & oriented X3, strength normal in all extremities, and gait normal.   Skin:  Intact without suspicious lesions or rashes Cervical Nodes:  No lymphadenopathy noted Axillary Nodes:  No palpable lymphadenopathy Inguinal Nodes:  No significant adenopathy Psych:  Cognition and judgment appear intact. Alert and cooperative with normal attention span and concentration. No  apparent delusions, illusions, hallucinations   Impression & Recommendations:  Problem # 1:  Preventive Health Care (ICD-V70.0)  Orders: Medicare -1st Annual Wellness Visit [phone]) TLB-PSA (Prostate Specific Antigen) (84153-PSA)  Problem # 2:  DYSLIPIDEMIA (ICD-272.4)  His updated medication list for this problem includes:    Pravastatin Sodium 20 Mg Tabs (Pravastatin sodium) ..... One daily will check a lipid profile  Orders: Venipuncture (47829) Specimen Handling (56213) TLB-Lipid Panel (80061-LIPID) TLB-BMP (Basic Metabolic Panel-BMET) (80048-METABOL) TLB-CBC Platelet - w/Differential (85025-CBCD) TLB-Hepatic/Liver Function Pnl (80076-HEPATIC) TLB-TSH (Thyroid Stimulating Hormone) (84443-TSH)  Problem # 3:  MILD COGNITIVE IMPAIRMENT SO STATED (ICD-331.83)  Problem # 4:  HYPERTENSION (ICD-401.9)  His updated medication list for this problem includes:    Benazepril-hydrochlorothiazide 20-12.5 Mg Tabs (Benazepril-hydrochlorothiazide) .Marland Kitchen... 1 once daily  Orders: EKG w/ Interpretation (93000) Venipuncture (08657) Specimen Handling (84696) TLB-BMP (Basic Metabolic Panel-BMET) (80048-METABOL) TLB-CBC Platelet - w/Differential (85025-CBCD) TLB-Hepatic/Liver Function Pnl (80076-HEPATIC) TLB-TSH (Thyroid Stimulating Hormone) (84443-TSH)  Problem # 5:  COPD (ICD-496)  Orders: Venipuncture (29528) TLB-BMP (Basic Metabolic Panel-BMET) (80048-METABOL) TLB-CBC Platelet - w/Differential (85025-CBCD) TLB-Hepatic/Liver Function Pnl (80076-HEPATIC) TLB-TSH (Thyroid Stimulating Hormone) (84443-TSH)  Complete Medication List: 1)  Benazepril-hydrochlorothiazide 20-12.5 Mg Tabs (Benazepril-hydrochlorothiazide) .Marland Kitchen.. 1 once daily 2)  Pravastatin Sodium 20 Mg Tabs (Pravastatin sodium) .... One daily  3)  Allegra 180 Mg Tabs (Fexofenadine hcl) .... One by mouth daily as needed 4)  Citalopram Hydrobromide 40 Mg Tabs (Citalopram hydrobromide) .... One daily 5)  Lorazepam 0.5 Mg Tabs  (Lorazepam) .... One tablet twice daily as needed 6)  Aspir-low 81 Mg Tbec (Aspirin) .Marland Kitchen.. 1 once daily 7)  Ambien 10 Mg Tabs (Zolpidem tartrate) .... One by mouth q hs 8)  Prednisone 20 Mg Tabs (Prednisone) .... One daily for one week, then one every other day 9)  Donepezil Hcl 10 Mg Tabs (Donepezil hcl) .... One daily 10)  Cortisporin 3.5-10000-1 Soln (Neomycin-polymyxin-hc) .... 2 cc in affected ear 6 x daily  Other Orders: Flu Vaccine 27yrs + MEDICARE PATIENTS (Z3664) Administration Flu vaccine - MCR (Q0347)  Patient Instructions: 1)  Please schedule a follow-up appointment in 6 months. 2)  Limit your Sodium (Salt). 3)  It is important that you exercise regularly at least 20 minutes 5 times a week. If you develop chest pain, have severe difficulty breathing, or feel very tired , stop exercising immediately and seek medical attention. 4)  You need to lose weight. Consider a lower calorie diet and regular exercise.  Prescriptions: DONEPEZIL HCL 10 MG TABS (DONEPEZIL HCL) one daily  #90 x 4   Entered and Authorized by:   Gordy Savers  MD   Signed by:   Gordy Savers  MD on 02/17/2010   Method used:   Print then Give to Patient   RxID:   [id number] LORAZEPAM 0.5 MG TABS (LORAZEPAM) one tablet twice daily as needed  #60 x 3   Entered and Authorized by:   Gordy Savers  MD   Signed by:   Gordy Savers  MD on 02/17/2010   Method used:   Print then Give to Patient   RxID:   [id number] CITALOPRAM HYDROBROMIDE 40 MG  TABS (CITALOPRAM HYDROBROMIDE) one daily  #90.0 Each x 6   Entered and Authorized by:   Gordy Savers  MD   Signed by:   Gordy Savers  MD on 02/17/2010   Method used:   Print then Give to Patient   RxID:   [id number] ALLEGRA 180 MG  TABS (FEXOFENADINE HCL) one by mouth daily as needed  #60.0 Each x 3   Entered and Authorized by:   Gordy Savers  MD   Signed by:   Gordy Savers  MD on 02/17/2010   Method used:    Print then Give to Patient   RxID:   [id number] PRAVASTATIN SODIUM 20 MG  TABS (PRAVASTATIN SODIUM) one daily  #90 Each x 2   Entered and Authorized by:   Gordy Savers  MD   Signed by:   Gordy Savers  MD on 02/17/2010   Method used:   Print then Give to Patient   RxID:   [id number] BENAZEPRIL-HYDROCHLOROTHIAZIDE 20-12.5 MG TABS (BENAZEPRIL-HYDROCHLOROTHIAZIDE) 1 once daily  #90.0 Each x 2   Entered and Authorized by:   Gordy Savers  MD   Signed by:   Gordy Savers  MD on 02/17/2010   Method used:   Print then Give to Patient   RxID:   [id number]

## 2010-07-11 NOTE — Progress Notes (Signed)
Summary: REQUEST FOR RETURN CALL  Phone Note Call from Patient   Summary of Call: Pts wife Sallye Ober) called to adv pt is being violent toward others, refusing to take care of himself or allow others to do so..... Spoke with [employer] [facility], they said to take pt to ED and have pt evaluated (then they will take him) but pts insurance will only pay for one day, and this will not help the pt per wife.... Pts wife request to speak with someone to see what can be done?   Call back at [phone] Sallye Ober) home or call pts daughter at [phone].   Initial call taken by: Debbra Riding,  March 28, 2010 11:42 AM  Follow-up for Phone Call        called Follow-up by: Gordy Savers  MD,  March 28, 2010 11:56 AM  Additional Follow-up for Phone Call Additional follow up Details #1::        Noted.  Additional Follow-up by: Debbra Riding,  March 28, 2010 2:52 PM

## 2010-07-11 NOTE — Progress Notes (Signed)
  Phone Note Call from Patient Call back at Home Phone [phone]   Caller: Spouse Call For: Riley Savers  MD Summary of Call: [name] [facility]) [phone] Needs sedative for husband when taking him to Assisted Living.  Initial call taken by: [facility] CMA AAMA,  May 08, 2010 9:10 AM  Follow-up for Phone Call        No answer on pt's number she gave [place]. Follow-up by: Lynann Beaver CMA AAMA,  May 08, 2010 10:51 AM  Additional Follow-up for Phone Call Additional follow up Details #1::        No answer, and pt's wife is not at her home number. Additional Follow-up by: Lynann Beaver CMA AAMA,  May 08, 2010 12:51 PM    ok to give extra dose of remeron and seroquel Per Dr. Kirtland Bouchard  Notified pt's wife.

## 2010-07-11 NOTE — Miscellaneous (Signed)
Summary: FL-2/[facility]  FL-2/[facility]   Imported By: Maryln Gottron 04/06/2010 11:11:40  _____________________________________________________________________  External Attachment:    Type:   Image     Comment:   External Document

## 2010-07-11 NOTE — Progress Notes (Signed)
Summary: insomnia  Phone Note Call from Patient   Caller: Spouse Call For: Riley Savers  MD Summary of Call: Pt is more confused and not sleeping at night.  Wife would like Rx to help him sleep.  [facility] ([place]) [phone] Initial call taken by: Lynann Beaver CMA,  June 22, 2009 10:32 AM  Follow-up for Phone Call        generic ambien 5  #30 one at bedtime as needed for slep Follow-up by: Riley Savers  MD,  June 22, 2009 11:03 AM    New/Updated Medications: AMBIEN 5 MG TABS (ZOLPIDEM TARTRATE) one by mouth q  hs as needed insomnia Prescriptions: AMBIEN 5 MG TABS (ZOLPIDEM TARTRATE) one by mouth q  hs as needed insomnia  #30 x 0   Entered by:   Lynann Beaver CMA   Authorized by:   Riley Savers  MD   Signed by:   Lynann Beaver CMA on 06/22/2009   Method used:   Telephoned to ...       [facility] [address] [phone]* (retail)       [address]       [place], [place]  [address]       Ph: [phone]       Fax: [phone]   RxID:   [id number]

## 2010-07-11 NOTE — Progress Notes (Signed)
Summary: need to speak with Dr. about Clovis Cao  Phone Note Call from Patient   Caller: Daughter PAM Summary of Call: needs to speak with Dr. Amador Cunas r/t dx put in on [place]@ - having priblems finding a place to accept Mr. Plancarte - may have to go out of state. please call to discuss  [phone]  *on my voice mail from wed afternoon - flexday   kik Initial call taken by: Duard Brady LPN,  April 27, 2010 10:47 AM  Follow-up for Phone Call        called Follow-up by: Gordy Savers  MD,  April 27, 2010 12:45 PM

## 2010-07-11 NOTE — Progress Notes (Signed)
Summary: Pts niece wants a returned call to discuss pt before appt  Phone Note Call from Patient Call back at [phone] cell   Caller: Niece - Denny Peon Summary of Call: Pts niece needs to talk to Dr. Amador Cunas before pt comes in for appt today, 07/07/09 at 10:30am. Please call asap.  Initial call taken by: Lucy Antigua,  July 07, 2009 8:46 AM  Follow-up for Phone Call        called Follow-up by: Gordy Savers  MD,  July 07, 2009 10:57 AM

## 2010-07-11 NOTE — Progress Notes (Signed)
Summary: refill lorazopam  Phone Note Refill Request Message from:  Fax from Pharmacy on May 16, 2010 12:39 PM  Refills Requested: Medication #1:  LORAZEPAM 0.5 MG TABS one tablet twice daily as needed [facility]   Method Requested: Fax to Local Pharmacy Initial call taken by: Duard Brady LPN,  May 16, 2010 12:39 PM    Prescriptions: LORAZEPAM 0.5 MG TABS (LORAZEPAM) one tablet twice daily as needed  #60 x 3   Entered by:   Duard Brady LPN   Authorized by:   Gordy Savers  MD   Signed by:   Duard Brady LPN on 56/21/3086   Method used:   Historical   RxID:   [id number]  faxed back to pharmacy. K<IK

## 2010-07-11 NOTE — Assessment & Plan Note (Signed)
Summary: back pain//ccm   Vital Signs:  Patient profile:   77 year old male Weight:      173 pounds Temp:     97.8 degrees F oral BP sitting:   110 / 70  (left arm) Cuff size:   regular  Vitals Entered By: Duard Brady LPN (Oct 17, 1608 3:27 PM) CC: c/o (R) low back/hip pain Is Patient Diabetic? No   CC:  c/o (R) low back/hip pain.  History of Present Illness: 77 year old patient, who presents with a several-day history of right lower back pain.  He has had degenerative joint disease and similar back pain in the past.  He has COPD and hypertension.  Pain is present in the right hip area and is aggravated by walking.  Denies any fever or constitutional complaints.  He is a tobacco smoker  Preventive Screening-Counseling & Management  Alcohol-Tobacco     Smoking Status: quit  Allergies (verified): No Known Drug Allergies  Past History:  Past Medical History: Reviewed history from 02/16/2009 and no changes required. COPD Depression Hypertension Low back pain Alcoholism abdominal incisional hernia cholesterol emboli, left eye peripheral vascular occlusive disease  Review of Systems       The patient complains of difficulty walking.  The patient denies anorexia, fever, weight loss, weight gain, vision loss, decreased hearing, hoarseness, chest pain, syncope, dyspnea on exertion, peripheral edema, prolonged cough, headaches, hemoptysis, abdominal pain, melena, hematochezia, severe indigestion/heartburn, hematuria, incontinence, genital sores, muscle weakness, suspicious skin lesions, transient blindness, depression, unusual weight change, abnormal bleeding, enlarged lymph nodes, angioedema, breast masses, and testicular masses.    Physical Exam  General:  Well-developed,well-nourished,in no acute distress; alert,appropriate and cooperative throughout examination Mouth:  Oral mucosa and oropharynx without lesions or exudates.  Teeth in good repair. Neck:  No  deformities, masses, or tenderness noted. Lungs:  Normal respiratory effort, chest expands symmetrically. Lungs are clear to auscultation, no crackles or wheezes. Heart:  Normal rate and regular rhythm. S1 and S2 normal without gallop, murmur, click, rub or other extra sounds. Msk:  range of motion left hip caused the discomfort, especially with external rotation   Impression & Recommendations:  Problem # 1:  BACK PAIN (ICD-724.5)  His updated medication list for this problem includes:    Aspir-low 81 Mg Tbec (Aspirin) .Marland Kitchen... 1 once daily  His updated medication list for this problem includes:    Aspir-low 81 Mg Tbec (Aspirin) .Marland Kitchen... 1 once daily  Problem # 2:  HYPERTENSION (ICD-401.9)  His updated medication list for this problem includes:    Benazepril-hydrochlorothiazide 20-12.5 Mg Tabs (Benazepril-hydrochlorothiazide) .Marland Kitchen... 1 once daily  His updated medication list for this problem includes:    Benazepril-hydrochlorothiazide 20-12.5 Mg Tabs (Benazepril-hydrochlorothiazide) .Marland Kitchen... 1 once daily  Problem # 3:  COPD (ICD-496)  Complete Medication List: 1)  Benazepril-hydrochlorothiazide 20-12.5 Mg Tabs (Benazepril-hydrochlorothiazide) .Marland Kitchen.. 1 once daily 2)  Pravastatin Sodium 20 Mg Tabs (Pravastatin sodium) .... One daily 3)  Allegra 180 Mg Tabs (Fexofenadine hcl) .... One by mouth daily as needed 4)  Citalopram Hydrobromide 40 Mg Tabs (Citalopram hydrobromide) .... One daily 5)  Lorazepam 0.5 Mg Tabs (Lorazepam) .... One tablet twice daily as needed 6)  Aspir-low 81 Mg Tbec (Aspirin) .Marland Kitchen.. 1 once daily 7)  Ambien 10 Mg Tabs (Zolpidem tartrate) .... One by mouth q hs 8)  Prednisone 20 Mg Tabs (Prednisone) .... One daily for one week, then one every other day  Patient Instructions: 1)  Please schedule a follow-up appointment in 4  months. 2)  Limit your Sodium (Salt) to less than 2 grams a day(slightly less than 1/2 a teaspoon) to prevent fluid retention, swelling, or worsening of  symptoms. 3)  You may move around but avoid painful motions. Apply ice to sore area for 20 minutes 3-4 times a day for 2-3 days. Prescriptions: PREDNISONE 20 MG TABS (PREDNISONE) one daily for one week, then one every other day  #14 x 0   Entered and Authorized by:   Gordy Savers  MD   Signed by:   Gordy Savers  MD on 10/17/2009   Method used:   Print then Give to Patient   RxID:   [id number]

## 2010-07-11 NOTE — Progress Notes (Signed)
Summary: Tylenol for back pain  Phone Note Call from Patient Call back at [phone] wife today   Caller: Patient Call For: Riley Savers  MD Summary of Call: Pt wife called reporting husband C/O back pain, "he hardly every gets out of bed".  Permission to give Tylenol with other meds.  Also, the depression med is working well. Initial call taken by: Sid Falcon LPN,  January 02, 2010 1:37 PM  Follow-up for Phone Call        ok Follow-up by: Riley Savers  MD,  January 02, 2010 2:12 PM  Additional Follow-up for Phone Call Additional follow up Details #1::        Wife informed Additional Follow-up by: Sid Falcon LPN,  January 02, 2010 4:32 PM

## 2010-07-11 NOTE — Progress Notes (Signed)
Summary: swimmers ear  Phone Note Call from Patient   Caller: Patient Call For: Riley Savers  MD Summary of Call: [facility] ([address]) Pt is complaining of "swimmer's ear" and wants RX called to pharmacy. [phone] Initial call taken by: Lynann Beaver CMA,  December 26, 2009 9:16 AM  Follow-up for Phone Call        Cortisporin otic  10 cc 2 gtts affected ear 6 times daily  RF 1 Follow-up by: Riley Savers  MD,  December 26, 2009 9:47 AM    New/Updated Medications: CORTISPORIN 3.5-10000-1 SOLN (NEOMYCIN-POLYMYXIN-HC) 2 cc in affected ear 6 x daily Prescriptions: CORTISPORIN 3.5-10000-1 SOLN (NEOMYCIN-POLYMYXIN-HC) 2 cc in affected ear 6 x daily  #10 cc x 1   Entered by:   Lynann Beaver CMA   Authorized by:   Riley Savers  MD   Signed by:   Lynann Beaver CMA on 12/26/2009   Method used:   Electronically to        [facility]. [phone]* (retail)       [address]       [place], [place]  [address]       Ph: [phone]       Fax: [phone]   RxID:   [id number] CORTISPORIN 3.5-10000-1 SOLN (NEOMYCIN-POLYMYXIN-HC) 2 cc in affected ear 6 x daily  #10 cc x 0   Entered by:   Lynann Beaver CMA   Authorized by:   Riley Savers  MD   Signed by:   Lynann Beaver CMA on 12/26/2009   Method used:   Electronically to        [facility]. [phone]* (retail)       [address]       [place], [place]  [address]       Ph: [phone]       Fax: [phone]   RxID:   [id number]  Pt. notified.  Appended Document: swimmers ear Called again about this.  Let her know it was done & to check with drugstore.
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
## 2010-07-11 NOTE — Assessment & Plan Note (Signed)
Summary: EVAL OF SORE ON CHEEK // RS   Vital Signs:  Patient profile:   77 year old male Weight:      165 pounds Temp:     97.9 degrees F oral BP sitting:   108 / 76  (left arm) Cuff size:   regular  Vitals Entered By: Raechel Ache, RN (July 07, 2009 10:21 AM) CC: Check sore R side neck- grandchild has MRSA   CC:  Check sore R side neck- grandchild has MRSA.  History of Present Illness: 77 year old patient who is seen today for follow up.  History of hypertension, dyslipidemia, and a history of COPD.  He has a grandchild at home and has been diagnosed with MRSA.  For the past few days, he has had an inflammatory nodule involving his right neck area.  In general, he seems to be doing fairly well.  Does have a history of alcohol abuse and mild cognitive impairment.  He has dyslipidemia.  Remains on pravastatin, and continues to do well.  Allergies: No Known Drug Allergies  Past History:  Past Medical History: Reviewed history from 02/16/2009 and no changes required. COPD Depression Hypertension Low back pain Alcoholism abdominal incisional hernia cholesterol emboli, left eye peripheral vascular occlusive disease  Physical Exam  General:  Well-developed,well-nourished,in no acute distress; alert,appropriate and cooperative throughout examination Head:  Normocephalic and atraumatic without obvious abnormalities. No apparent alopecia or balding. Mouth:  Oral mucosa and oropharynx without lesions or exudates.  Teeth in good repair. Neck:  a 0.5-cm, erythematous nodule noted involving his right lateral neck area Lungs:  Normal respiratory effort, chest expands symmetrically. Lungs are clear to auscultation, no crackles or wheezes. Heart:  Normal rate and regular rhythm. S1 and S2 normal without gallop, murmur, click, rub or other extra sounds.   Impression & Recommendations:  Problem # 1:  SEBACEOUS CYST, INFECTED (ICD-706.2)  Problem # 2:  HYPERTENSION  (ICD-401.9)  His updated medication list for this problem includes:    Benazepril-hydrochlorothiazide 20-12.5 Mg Tabs (Benazepril-hydrochlorothiazide) .Marland Kitchen... 1 once daily  Complete Medication List: 1)  Benazepril-hydrochlorothiazide 20-12.5 Mg Tabs (Benazepril-hydrochlorothiazide) .Marland Kitchen.. 1 once daily 2)  Pravastatin Sodium 20 Mg Tabs (Pravastatin sodium) .... One daily 3)  Allegra 180 Mg Tabs (Fexofenadine hcl) .... One by mouth daily as needed 4)  Citalopram Hydrobromide 40 Mg Tabs (Citalopram hydrobromide) .... One daily 5)  Lorazepam 0.5 Mg Tabs (Lorazepam) .... One tablet twice daily as needed 6)  Aspir-low 81 Mg Tbec (Aspirin) .Marland Kitchen.. 1 once daily 7)  Ambien 5 Mg Tabs (Zolpidem tartrate) .... One by mouth q  hs as needed insomnia 8)  Sulfamethoxazole-tmp Ds 800-160 Mg Tabs (Sulfamethoxazole-trimethoprim) .... One twice daily  Patient Instructions: 1)  Please schedule a follow-up appointment in 4 months. 2)  Limit your Sodium (Salt) to less than 2 grams a day(slightly less than 1/2 a teaspoon) to prevent fluid retention, swelling, or worsening of symptoms. 3)  It is important that you exercise regularly at least 20 minutes 5 times a week. If you develop chest pain, have severe difficulty breathing, or feel very tired , stop exercising immediately and seek medical attention. Prescriptions: SULFAMETHOXAZOLE-TMP DS 800-160 MG TABS (SULFAMETHOXAZOLE-TRIMETHOPRIM) one twice daily  #14 x 0   Entered and Authorized by:   Gordy Savers  MD   Signed by:   Gordy Savers  MD on 07/07/2009   Method used:   Print then Give to Patient   RxID:   [id number]
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
## 2010-07-11 NOTE — Assessment & Plan Note (Signed)
Summary: demetia worse , med review  / kik   Vital Signs:  Day profile:   77 year old male Weight:      172 pounds Temp:     98.1 degrees F oral BP sitting:   122 / 70  (right arm) Cuff size:   regular  Vitals Entered By: Duard Brady LPN (December 19, 2009 10:03 AM) CC: c/o dementia getting worse - voilent, wandering , talking inapproa. to young girls per family Is Day Diabetic? No   CC:  c/o dementia getting worse - voilent, wandering , and talking inapproa. to young girls per family.  History of Present Illness: [name] who has a history of prior alcoholism.  He also has COPD, hypertension, and history of depression.  There is a history also of cognitive impairment.  The family relates a history of increasing confusion and nocturnal wandering.  Apparently, he often leaves the house at night, and tries to get into a neighbor's house.  There is been inappropriate comments of a sexual nature to young women. his depression has been stable. He denies any pulmonary complaints his only person complain of some occasional dizziness.  The family does monitor his blood pressure readings at home and there is been no documented hypotension  Preventive Screening-Counseling & Management  Alcohol-Tobacco     Smoking Status: quit  Allergies (verified): No Known Drug Allergies  Past History:  Past Medical History: COPD Depression Hypertension Low back pain Alcoholism abdominal incisional hernia cholesterol emboli, left eye peripheral vascular occlusive disease dementia  Past Surgical History: Reviewed history from 02/16/2009 and no changes required. Abdominal aortic aneurysm repair 2003 status post ventral hernia repair in August 2009 status post surgery for pilonidal cyst  Family History: Reviewed history from 02/16/2009 and no changes required. father died at  64 of a stroke and mother died at 12 3 brothers and 4 sisters no family history of  cancer  Review of Systems       The Day complains of depression.  The Day denies anorexia, fever, weight loss, weight gain, vision loss, decreased hearing, hoarseness, chest pain, syncope, dyspnea on exertion, peripheral edema, prolonged cough, headaches, hemoptysis, abdominal pain, melena, hematochezia, severe indigestion/heartburn, hematuria, incontinence, genital sores, muscle weakness, suspicious skin lesions, transient blindness, difficulty walking, unusual weight change, abnormal bleeding, enlarged lymph nodes, angioedema, breast masses, and testicular masses.    Physical Exam  General:  Well-developed,well-nourished,in no acute distress; alert,appropriate and cooperative throughout examination; blood pressure, normal Head:  Normocephalic and atraumatic without obvious abnormalities. No apparent alopecia or balding. Mouth:  Oral mucosa and oropharynx without lesions or exudates.  Teeth in good repair. Neck:  No deformities, masses, or tenderness noted. Lungs:  Normal respiratory effort, chest expands symmetrically. Lungs are clear to auscultation, no crackles or wheezes. Heart:  Normal rate and regular rhythm. S1 and S2 normal without gallop, murmur, click, rub or other extra sounds. Abdomen:  Bowel sounds positive,abdomen soft and non-tender without masses, organomegaly or hernias noted. Msk:  No deformity or scoliosis noted of thoracic or lumbar spine.   Neurologic:  alert and appropriate; moderate cognitive impairment.  Was able to name the year, but not the month or the season of the year strength normal in all extremities, gait normal, and finger-to-nose normal.  strength normal in all extremities, gait normal, and finger-to-nose normal.     Impression & Recommendations:  Problem # 1:  DEMENTIA (ICD-294.8)  Problem # 2:  HYPERTENSION (ICD-401.9)  His updated medication list for this  problem includes:    Benazepril-hydrochlorothiazide 20-12.5 Mg Tabs  (Benazepril-hydrochlorothiazide) .Marland Kitchen... 1 once daily  His updated medication list for this problem includes:    Benazepril-hydrochlorothiazide 20-12.5 Mg Tabs (Benazepril-hydrochlorothiazide) .Marland Kitchen... 1 once daily  Problem # 3:  COPD (ICD-496)  Complete Medication List: 1)  Benazepril-hydrochlorothiazide 20-12.5 Mg Tabs (Benazepril-hydrochlorothiazide) .Marland Kitchen.. 1 once daily 2)  Pravastatin Sodium 20 Mg Tabs (Pravastatin sodium) .... One daily 3)  Allegra 180 Mg Tabs (Fexofenadine hcl) .... One by mouth daily as needed 4)  Citalopram Hydrobromide 40 Mg Tabs (Citalopram hydrobromide) .... One daily 5)  Lorazepam 0.5 Mg Tabs (Lorazepam) .... One tablet twice daily as needed 6)  Aspir-low Riley Mg Tbec (Aspirin) .Marland Kitchen.. 1 once daily 7)  Ambien 10 Mg Tabs (Zolpidem tartrate) .... One by mouth q hs 8)  Prednisone 20 Mg Tabs (Prednisone) .... One daily for one week, then one every other day 9)  Donepezil Hcl 10 Mg Tabs (Donepezil hcl) .... One daily  Day Instructions: 1)  Please schedule a follow-up appointment in 3 months for CPX 2)  Limit your Sodium (Salt). 3)  It is important that you exercise regularly at least 20 minutes 5 times a week. If you develop chest pain, have severe difficulty breathing, or feel very tired , stop exercising immediately and seek medical attention. Prescriptions: DONEPEZIL HCL 10 MG TABS (DONEPEZIL HCL) one daily  #90 x 4   Entered and Authorized by:   Gordy Savers  MD   Signed by:   Gordy Savers  MD on 12/19/2009   Method used:   Print then Give to Day   RxID:   [id number] AMBIEN 10 MG TABS (ZOLPIDEM TARTRATE) one by mouth q hs  #30 x 2   Entered and Authorized by:   Gordy Savers  MD   Signed by:   Gordy Savers  MD on 12/19/2009   Method used:   Print then Give to Day   RxID:   [id number] LORAZEPAM 0.5 MG TABS (LORAZEPAM) one tablet twice daily as needed  #60 x 3   Entered and Authorized by:   Gordy Savers  MD    Signed by:   Gordy Savers  MD on 12/19/2009   Method used:   Print then Give to Day   RxID:   [id number] CITALOPRAM HYDROBROMIDE 40 MG  TABS (CITALOPRAM HYDROBROMIDE) one daily  #90.0 Each x 6   Entered and Authorized by:   Gordy Savers  MD   Signed by:   Gordy Savers  MD on 12/19/2009   Method used:   Print then Give to Day   RxID:   [id number] ALLEGRA 180 MG  TABS (FEXOFENADINE HCL) one by mouth daily as needed  #60.0 Each x 3   Entered and Authorized by:   Gordy Savers  MD   Signed by:   Gordy Savers  MD on 12/19/2009   Method used:   Print then Give to Day   RxID:   [id number] PRAVASTATIN SODIUM 20 MG  TABS (PRAVASTATIN SODIUM) one daily  #90 Each x 2   Entered and Authorized by:   Gordy Savers  MD   Signed by:   Gordy Savers  MD on 12/19/2009   Method used:   Print then Give to Day   RxID:   [id number] BENAZEPRIL-HYDROCHLOROTHIAZIDE 20-12.5 MG TABS (BENAZEPRIL-HYDROCHLOROTHIAZIDE) 1 once daily  #90.0 Each x 2   Entered and Authorized by:   Gordy Savers  MD   Signed by:   Gordy Savers  MD on 12/19/2009   Method used:   Print then Give to Day   RxID:   [id number]

## 2010-07-11 NOTE — Progress Notes (Signed)
Summary: not sleeping  Phone Note Call from Patient   Caller: Spouse Call For: Riley Savers  MD Summary of Call: Pt's wife is calling to let  Dr. Kirtland Bouchard know he is not sleeping at all with the Ambien 5 mg.   Can he increase it or change the RX? [phone] [name] Initial call taken by: Lynann Beaver CMA,  August 10, 2009 9:01 AM  Follow-up for Phone Call        OK to increase to 10 mg Follow-up by: Riley Savers  MD,  August 10, 2009 12:17 PM  Additional Follow-up for Phone Call Additional follow up Details #1::        Pt. notified. Additional Follow-up by: Lynann Beaver CMA,  August 10, 2009 12:34 PM    New/Updated Medications: AMBIEN 10 MG TABS (ZOLPIDEM TARTRATE) one by mouth q hs

## 2010-07-11 NOTE — Progress Notes (Signed)
Summary: refill namenda and remeron  Phone Note Refill Request Message from:  Fax from Pharmacy on April 14, 2010 1:59 PM  Refills Requested: Medication #1:  NAMENDA 10 MG TABS qd  Medication #2:  REMERON 15 MG TABS qhs [facility] [address]   Method Requested: Fax to Local Pharmacy Initial call taken by: Duard Brady LPN,  April 14, 2010 2:00 PM    Prescriptions: REMERON 15 MG TABS (MIRTAZAPINE) qhs  #30 x 6   Entered by:   Duard Brady LPN   Authorized by:   Gordy Savers  MD   Signed by:   Duard Brady LPN on 16/03/9603   Method used:   Faxed to ...       [facility] [phone]* (retail)       [address]       [place], [place]  [address]       Ph: [phone]       Fax: [phone]   RxID:   [id number] NAMENDA 10 MG TABS (MEMANTINE HCL) qd  #30 x 6   Entered by:   Duard Brady LPN   Authorized by:   Gordy Savers  MD   Signed by:   Duard Brady LPN on 53/66/4403   Method used:   Faxed to ...       [facility] [phone]* (retail)       [address]       [place], [place]  [address]       Ph: [phone]       Fax: [phone]   RxID:   [id number]  [facility]   KIK

## 2010-07-11 NOTE — Progress Notes (Signed)
Summary: [facility] called req recent ov notes and clarification  Phone Note From Other Clinic Call back at [phone] [facility] with [facility]   Caller: [facility]    - Victorino Dike Summary of Call: Called to req copy of pts last office notes, also needs clarification of Seroquell order. Dr wrote on FL-2 that it is prn, but pts family says that pt is on scheduled dose, 25mg  every night at 9pm. Pls send to [facility] fax# [phone] Initial call taken by: Lucy Antigua,  April 05, 2010 11:26 AM  Follow-up for Phone Call        ok/  d/c seroquel Follow-up by: Gordy Savers  MD,  April 06, 2010 5:57 PM  Additional Follow-up for Phone Call Additional follow up Details #1::        LMTCB  Alfred Levins, CMA  April 07, 2010 9:39 AM    Additional Follow-up for Phone Call Additional follow up Details #2::    spoke with Victorino Dike and she was just concerned his dementia and behavioral issues.  I faxed over OV notes and [facility] notes verbal ok from Dr Kirtland Bouchard Follow-up by: Alfred Levins, CMA,  April 07, 2010 4:27 PM  OK  d/c  seroquil

## 2010-07-11 NOTE — Miscellaneous (Signed)
Summary: FL-2  FL-2   Imported By: Maryln Gottron 05/03/2010 09:56:06  _____________________________________________________________________  External Attachment:    Type:   Image     Comment:   External Document

## 2010-08-16 ENCOUNTER — Encounter: Payer: Self-pay | Admitting: Internal Medicine

## 2010-08-18 ENCOUNTER — Ambulatory Visit: Payer: Self-pay | Admitting: Internal Medicine

## 2010-08-18 DIAGNOSIS — Z0289 Encounter for other administrative examinations: Secondary | ICD-10-CM

## 2010-08-24 LAB — CBC
HCT: 46 % (ref 39.0–52.0)
Hemoglobin: 15.7 g/dL (ref 13.0–17.0)
MCH: 29.8 pg (ref 26.0–34.0)
MCHC: 34.1 g/dL (ref 30.0–36.0)
MCV: 87.6 fL (ref 78.0–100.0)
Platelets: 280 10*3/uL (ref 150–400)
RBC: 5.25 MIL/uL (ref 4.22–5.81)
RDW: 13.5 % (ref 11.5–15.5)
WBC: 10.1 10*3/uL (ref 4.0–10.5)

## 2010-08-24 LAB — BASIC METABOLIC PANEL WITH GFR
BUN: 12 mg/dL (ref 6–23)
CO2: 28 meq/L (ref 19–32)
Chloride: 101 meq/L (ref 96–112)
GFR calc non Af Amer: 54 mL/min — ABNORMAL LOW (ref >60)
Glucose, Bld: 125 mg/dL — ABNORMAL HIGH (ref 70–99)
Potassium: 4.6 meq/L (ref 3.5–5.1)
Sodium: 139 meq/L (ref 135–145)

## 2010-08-24 LAB — URINALYSIS, ROUTINE W REFLEX MICROSCOPIC
Glucose, UA: NEGATIVE mg/dL
Hgb urine dipstick: NEGATIVE
Ketones, ur: 15 mg/dL — AB
Leukocytes, UA: NEGATIVE
Nitrite: NEGATIVE
Protein, ur: >300 mg/dL — AB
Specific Gravity, Urine: 1.041 — ABNORMAL HIGH (ref 1.005–1.030)
Urobilinogen, UA: 1 mg/dL (ref 0.0–1.0)
pH: 5 (ref 5.0–8.0)

## 2010-08-24 LAB — DIFFERENTIAL
Basophils Absolute: 0.1 K/uL (ref 0.0–0.1)
Basophils Relative: 1 % (ref 0–1)
Eosinophils Absolute: 0.2 10*3/uL (ref 0.0–0.7)
Eosinophils Relative: 2 % (ref 0–5)
Lymphocytes Relative: 12 % (ref 12–46)
Lymphs Abs: 1.2 10*3/uL (ref 0.7–4.0)
Monocytes Absolute: 0.8 K/uL (ref 0.1–1.0)
Monocytes Relative: 8 % (ref 3–12)
Neutro Abs: 7.8 K/uL — ABNORMAL HIGH (ref 1.7–7.7)
Neutrophils Relative %: 78 % — ABNORMAL HIGH (ref 43–77)

## 2010-08-24 LAB — URINE MICROSCOPIC-ADD ON

## 2010-08-24 LAB — BASIC METABOLIC PANEL
Calcium: 9.6 mg/dL (ref 8.4–10.5)
Creatinine, Ser: 1.29 mg/dL (ref 0.4–1.5)
GFR calc Af Amer: >60 mL/min (ref >60)

## 2010-10-17 ENCOUNTER — Emergency Department (HOSPITAL_COMMUNITY)
Admission: EM | Admit: 2010-10-17 | Discharge: 2010-10-18 | Disposition: A | Discharge status: Home / Self Care | Payer: Medicare Other | Attending: Emergency Medicine | Admitting: Emergency Medicine

## 2010-10-17 DIAGNOSIS — F0280 Dementia in other diseases classified elsewhere without behavioral disturbance: Secondary | ICD-10-CM | POA: Insufficient documentation

## 2010-10-17 DIAGNOSIS — E785 Hyperlipidemia, unspecified: Secondary | ICD-10-CM | POA: Insufficient documentation

## 2010-10-17 DIAGNOSIS — I10 Essential (primary) hypertension: Secondary | ICD-10-CM | POA: Insufficient documentation

## 2010-10-17 DIAGNOSIS — I739 Peripheral vascular disease, unspecified: Secondary | ICD-10-CM | POA: Insufficient documentation

## 2010-10-17 DIAGNOSIS — Z0000 Encounter for general adult medical examination without abnormal findings: Secondary | ICD-10-CM | POA: Insufficient documentation

## 2010-10-17 DIAGNOSIS — J449 Chronic obstructive pulmonary disease, unspecified: Secondary | ICD-10-CM | POA: Insufficient documentation

## 2010-10-17 DIAGNOSIS — J4489 Other specified chronic obstructive pulmonary disease: Secondary | ICD-10-CM | POA: Insufficient documentation

## 2010-10-17 DIAGNOSIS — G309 Alzheimer's disease, unspecified: Secondary | ICD-10-CM | POA: Insufficient documentation

## 2010-10-17 DIAGNOSIS — I714 Abdominal aortic aneurysm, without rupture, unspecified: Secondary | ICD-10-CM | POA: Insufficient documentation

## 2010-10-24 NOTE — Assessment & Plan Note (Signed)
OFFICE VISIT   [name], [name]  DOB:  Apr 19, 1934                                       01/11/2009  YNWGN#:[id number]   I saw the patient in the office today concerning [name] possible cholesterol  emboli to the left eye.  This is [name] pleasant 77 year old gentleman who I  had previously repaired an aneurysm on.  He had recently been evaluated  by his ophthalmologist and it was felt that he had new small cholesterol  emboli involving the left eye.  He was sent for [name] carotid evaluation.   The patient denies any previous history of stroke, TIAs, expressive or  receptive aphasia or amaurosis fugax.  He has not been on aspirin.   PAST MEDICAL HISTORY:  Significant for COPD, depression, hypertension,  history of alcohol abuse and low back pain.  He denies any history of  diabetes, history of previous myocardial infarction or history of  congestive heart failure.   SOCIAL HISTORY:  He is divorced.  He quit tobacco approximately 8 months  ago.  He had smoked [name] pack per day for many years.   FAMILY HISTORY:  There is no history of premature cardiovascular  disease.   REVIEW OF SYSTEMS AND MEDICATIONS:  Are documented on the medical  history form in his chart.   PHYSICAL EXAMINATION:  Blood pressure is 126/83, heart rate is 84.  I do  not detect any carotid bruits.  Lungs are clear bilaterally to  auscultation.  On cardiac exam he has [name] regular rate and rhythm.  Abdomen is soft and nontender.  His previous midline incision is healed.  He has palpable femoral pulses.  I cannot palpate pedal pulses although  both feet appear adequately perfused.  He does have dependent rubor.  He  has no significant lower extremity swelling.  Neurologic exam reveals  good strength in the upper extremities and lower extremities  bilaterally.  There are no significant paresthesias.   Carotid duplex scan in our office on 12/30/2008 showed no significant  carotid disease on either side.   Both vertebral arteries were patent  with normally directed flow.   I reassured the patient that we do not see any significant carotid  disease on his carotid duplex scan.  In addition, he is asymptomatic and  therefore I would not recommend any further vascular workup.  I have  encouraged him to begin taking an aspirin daily.  If he had any focal  neurologic symptoms then the only other consideration would be to obtain  [name] cerebral arteriogram to look for [name] carotid ulcer.  However, the  arteriogram itself would be associated with [name] 1-2% risk of stroke and at  this point I do not think that risk is justified.  I plan on seeing him  back in 1 year with [name] followup carotid duplex scan.  He knows to call  sooner if he has problems.   Di Kindle. Edilia Bo, M.D.  Electronically Signed   CSD/MEDQ  D:  01/11/2009  T:  01/12/2009  Job:  [id number]   cc:   Gordy Savers, MD

## 2010-10-24 NOTE — Op Note (Signed)
[name], [name]                ACCOUNT NO.:  [id number]   MEDICAL RECORD NO.:  [id number]          PATIENT TYPE:  INP   LOCATION:  0007                         FACILITY:  [facility]   PHYSICIAN:  Velora Heckler, MD      DATE OF BIRTH:  April 04, 1934   DATE OF PROCEDURE:  01/29/2008  DATE OF DISCHARGE:                               OPERATIVE REPORT   PREOPERATIVE DIAGNOSIS:  Ventral incisional hernia.   POSTOPERATIVE DIAGNOSIS:  Ventral incisional hernia.   PROCEDURE:  Laparoscopic repair of ventral incisional hernia with  polyester mesh.   SURGEON:  Velora Heckler, MD, FACS   ANESTHESIA:  General.   ESTIMATED BLOOD LOSS:  Minimal.   PREPARATION:  Betadine.   COMPLICATIONS:  None.   INDICATIONS:  The patient is a 77 year old white male from [place],  [place].  He developed a ventral incisional hernia following  abdominal aortic aneurysm repair.  The patient has had mild to moderate  discomfort particularly with physical activity.  He has had no signs or  symptoms of obstruction.  He now comes to surgery for repair of ventral  incisional hernia.   PROCEDURE IN DETAIL:  The procedure is done in OR #11 at the [facility].  The patient is brought to the operating room and placed in a  supine position on the operating room table.  Following administration  of general anesthesia the abdomen was prepped and draped in the usual  strict aseptic fashion.  After ascertaining that an adequate level of  anesthesia had been achieved an incision is made at the lateral left  costal margin.  Using an OptiView trocar and fiberoptic scope the trocar  is advanced through subcutaneous tissues and muscular wall, the abdomen  into the peritoneal cavity under direct vision.  Scope was withdrawn.  Using carbon dioxide the abdomen was then insufflated.  The scope was  reintroduced and the abdomen explored.  There are adhesions to the  anterior abdominal wall.  Operative ports were  placed in the left lower  quadrant, right lower quadrant and right upper quadrant under direct  vision.  Using the Harmonic scalpel for hemostasis the adhesions to the  anterior abdominal wall were taken down.  Changing to a 30 degree scope  a clear visualization of the anterior abdominal wall was achieved.  There is a complex hernia defect at the level of the umbilicus extending  upwards and to the left.  The entire defect measures approximately 4 cm.   A 12 cm diameter polyester mesh is selected.  It is prepared with four  interrupted 0 Novofil sutures circumferentially.  Mesh is dampened,  rolled and inserted into the peritoneal cavity and deployed.  Incisions  are made on the anterior abdominal wall to correspond with the suture  placement.  Using the retriever the suture tags are retrieved from the  peritoneal cavity and the entire mesh was then elevated anteriorly  against the peritoneal surface of the abdominal wall.  The mesh appears  to be properly deployed.  There is at least a 4 cm overlap of  normal  abdominal wall past the hernia defect circumferentially.  Sutures were  tied securely.  Using Ethicon ProTacks two concentric circles of tacks  are placed in the mesh to secure it to the anterior abdominal wall.  Ports were removed under direct vision and good hemostasis was noted  throughout the operative field.  Pneumoperitoneum was released.  All  ports were removed.  Port sites were anesthetized with local anesthetic.  Port site wounds were closed with interrupted 4-0 Monocryl subcuticular  sutures.  Wounds are washed and dried and Steri-Strips were applied to  all wounds.  A 12 inch abdominal binder was placed around the abdomen.  The patient was awakened from anesthesia and brought to the recovery  room in stable condition.  The patient tolerated the procedure well.      Velora Heckler, MD  Electronically Signed     TMG/MEDQ  D:  01/29/2008  T:  01/29/2008  Job:   [id number]   cc:   Gordy Savers, MD  [address] [place]  [place] [address]   Di Kindle. Edilia Bo, M.D.  [address]  [place]  [place] [address]

## 2010-10-24 NOTE — Procedures (Signed)
CAROTID DUPLEX EXAM   INDICATION:  Carotid artery disease.   HISTORY:  Diabetes:  No.  Cardiac:  No.  Hypertension:  Yes.  Smoking:  No.  Previous Surgery:  No.  CV History:  Asymptomatic.  Amaurosis Fugax No, Paresthesias No, Hemiparesis No                                       RIGHT             LEFT  Brachial systolic pressure:         150               148  Brachial Doppler waveforms:         WNL               WNL  Vertebral direction of flow:        Antegrade         Antegrade  DUPLEX VELOCITIES (cm/sec)  CCA peak systolic                   79                80  ECA peak systolic                   97                86  ICA peak systolic                   82                77  ICA end diastolic                   33                27  PLAQUE MORPHOLOGY:                  Calcific          Calcific  PLAQUE AMOUNT:                      Mild              Mild  PLAQUE LOCATION:                    ICA               ICA   IMPRESSION:  Bilateral internal carotid artery velocities indicate no  hemodynamically significant stenosis.          ___________________________________________  Di Kindle. Edilia Bo, M.D.   EM/MEDQ  D:  02/02/2010  T:  02/02/2010  Job:  [id number]

## 2010-10-24 NOTE — Assessment & Plan Note (Signed)
[facility]                         GASTROENTEROLOGY OFFICE NOTE   RUARI, [name]                       MRN:          [id number]  DATE:06/17/2007                            DOB:          07-Aug-1933    [name] is brought in for examination by his daughter who is an Astronomer.  I performed colonoscopy on him in December of 2008 as a screening  procedure through the request of Dr. Amador Cunas.  He had a couple of  benign polyps removed, but otherwise his colonoscopy was otherwise  unremarkable, except for diffuse pancolonic diverticulosis.   Mr. Kinzler complains of lower abdominal discomfort, gas, bloating and  some chronic constipation.  He denies upper GI or hepatobiliary  complaints, but does have a past history of heavy ethanol abuse with  apparently hospitalization for alcohol discontinuation some year ago.  He and his daughter agree that he has been sober for the past year.  He  gives no history of known pancreatitis, hepatitis or other known liver  disease or icterus, chronic fatigue, pruritus, etc.  His daughter has  noticed rather marked mental status changes over the last year, and he  has had his license taken from him by his wife.  He has had no history  of seizure disorder.   PAST MEDICAL HISTORY:  He is followed by Dr. Amador Cunas for  hypertensive cardiovascular disease and chronic depression.   MEDICATIONS:  1. Lipitor 20 mg a day.  2. Benazepril HCTZ 20/12.5 mg a day.  3. Lexapro 10 mg daily.  4. Multivitamins.  5. Timolol eye drops.   ALLERGIES:  He denies drug allergies.   LABORATORY STUDIES:  Recent laboratory data on March 26, 2007 showed a  normal CBC.  Metabolic profile included liver function tests with an  albumin of 4.0 gm%, and a normal PSA.   FAMILY HISTORY:  Remarkable for atherosclerosis and hypertension, but no  known gastrointestinal problems.   SOCIAL HISTORY:  The patient apparently in the past has been a  heavy  smoker and carries a diagnosis of COPD, depression, chronic low back  pain.  He continues to smoke 1 pack of cigarettes per day.  The patient  also has had previous abdominal aortic aneurysm with stenting and repair  last performed by Dr. Cari Caraway in December of 2002.  At the time of  his surgery, he had an abdominal ultrasound exam which showed a normal  pancreas, gallbladder and liver except for some increased echodensity.   PHYSICAL EXAMINATION:  GENERAL:  He is a healthy-appearing white male in  no acute distress, appearing his stated age.  I cannot appreciate  stigmata of chronic liver disease.  VITAL SIGNS:  He weighs 147 pounds.  Blood pressure is 118/62, pulse 86  and regular.  NEUROLOGIC:  I could not appreciate any definitive mental status changes  or evidence of asterixis.  His gait was normal, and he had normal  balance, no cerebellar signs and good finger-to-nose movement.  ABDOMEN:  No hepatosplenomegaly, masses, tenderness or ascites.  Bowel  sounds were normal.  There was no peripheral edema  or phlebitis.   ASSESSMENT:  1. Symptomatic diverticulosis with recent colonoscopy showing some      prominent adenomatous colon polyps which were removed.  2. Well-controlled essential hypertension.  3. History of hyperlipidemia.  4. Rule out occult liver disease with associated low-grade      encephalopathy.  5. Chronic cigarette abuse with I imagine a normal chest x-ray per Dr.      Alwyn Ren.  6. Chronic depression on Lipitor.   RECOMMENDATIONS:  1. High fiber diet with daily Benefiber and liberal p.o. fluids.  2. Check screening laboratory parameters for occult liver disease.  3. Follow up colonoscopy as per usual follow up intervals.  4. GI follow up in 1 month's time.  Serum ammonia level was ordered      today, and will treat with lactulose or Xifaxan depending on this      clinical result.     Vania Rea. Jarold Motto, MD, Caleen Essex, FAGA  Electronically  Signed    DRP/MedQ  DD: 06/17/2007  DT: 06/18/2007  Job #: [id number]   cc:   Gordy Savers, MD

## 2010-10-24 NOTE — Procedures (Signed)
CAROTID DUPLEX EXAM   INDICATION:  Cholesterol emboli left eye.   HISTORY:  Diabetes:  No.  Cardiac:  No.  Hypertension:  No.  Smoking:  Quit.  Previous Surgery:  No.  CV History:  No.  Amaurosis Fugax No, Paresthesias No, Hemiparesis No                                       RIGHT             LEFT  Brachial systolic pressure:         124               124  Brachial Doppler waveforms:         Biphasic          Biphasic  Vertebral direction of flow:        Antegrade         Antegrade  DUPLEX VELOCITIES (cm/sec)  CCA peak systolic                   75                101  ECA peak systolic                   84                87  ICA peak systolic                   64                86  ICA end diastolic                   25                30  PLAQUE MORPHOLOGY:                  Heterogeneous     Heterogeneous  PLAQUE AMOUNT:                      Moderate          Moderate  PLAQUE LOCATION:                    Bif/ICA           Bif/ICA   IMPRESSION:  Bilateral 20-39% internal carotid artery stenosis.   ___________________________________________  Di Kindle. Edilia Bo, M.D.   AC/MEDQ  D:  12/30/2008  T:  12/30/2008  Job:  [id number]

## 2010-10-27 NOTE — H&P (Signed)
NAMEYACOB, WILKERSON NO.:  [id number]   MEDICAL RECORD NO.:  [id number]          PATIENT TYPE:  EMS   LOCATION:  MAJO                         FACILITY:  [facility]   PHYSICIAN:  Gordy Savers, M.D. LHCDATE OF BIRTH:  Sep 13, 1933   DATE OF ADMISSION:  09/04/2005  DATE OF DISCHARGE:  09/04/2005                                HISTORY & PHYSICAL   HISTORY OF PRESENT ILLNESS:  The patient is a 77 year old gentleman who is  status post endovascular abdominal aortic aneurysm repair in December of  2002. At that time, the aneurysm which was infrarenal measured 5.7 cm. The  patient has been followed closely by CVTS. The patient is in need of repeat  endovascular repair due to malposition of the graft. The patient was seen in  the office 2 weeks ago with alcohol withdrawal syndrome and was placed on a  Lithium protocol at that time. He has tapered the Librium but unfortunately  continues to drink. The patient is now admitted for further evaluation and  treatment of his alcohol withdrawal syndrome.   PAST MEDICAL HISTORY:  He has a history of hypertension,  hypercholesterolemia and glaucoma. Past history also of COPD and ongoing  tobacco use. He has seen Dr. Renne Crigler in the past and has had pilonidal cysts  and perirectal abscesses surgically repaired.   CURRENT MEDICATIONS:  His present medical regimen includes:  1.  Lotensin 20/12.5 daily.  2.  Timolol opthalmic drops 0.5% to both eyes daily.  3.  Lipitor 10 mg daily.  4.  Multivitamins.   REVIEW OF SYSTEMS:  Review of system exam is otherwise fairly unremarkable.  The patient had a stress adenosine performed preoperatively that revealed no  diagnostic ST changes to suggest ischemia. This was felt to be an overall  low risk scan.   FAMILY HISTORY:  Fairly unremarkable. Several siblings are well. Parents  both lived to be quite elderly.   PHYSICAL EXAMINATION:  GENERAL:  Revealed a well-developed white male who  appeared somewhat weak but not particularly tremulous.  VITAL SIGNS:  Blood pressure was 120/80.  SKIN:  Negative.  HEAD/NECK:  Revealed some xanthelasma otherwise negative. Neck no bruits or  adenopathy.  CHEST:  Revealed diminished breath sounds but clear.  CARDIOVASCULAR:  Normal S1, S2, a few ectopic's noted.  ABDOMEN:  Benign. Left femoral bruit was noted.  GENITOURINARY:  Unremarkable except for some testicular atrophy.  EXTREMITIES:  Negative. Posterior tibial pulses were intact. Dorsalis pedis  pulses were absent.  NEUROLOGIC:  Negative.  RECTAL:  Revealed prostate minimally enlarged, there was considerable  perirectal scarring noted.   IMPRESSION:  Alcoholism with withdrawal syndrome, hypertension, COPD, status  post prior endovascular abdominal aortic aneurysm repair.   DISPOSITION:  The patient will be admitted to the hospital, treated with a  Librium protocol, routine studies will be checked preoperatively. Hopefully  after successful detox surgery can proceed.           ______________________________  Gordy Savers, M.D. [facility]     PFK/MEDQ  D:  09/19/2005  T:  09/19/2005  Job:  [id number]

## 2010-10-27 NOTE — Discharge Summary (Signed)
[name], [name]                ACCOUNT NO.:  [id number]   MEDICAL RECORD NO.:  [id number]          PATIENT TYPE:  IPS   LOCATION:  0305                          FACILITY:  [facility]   PHYSICIAN:  Anselm Jungling, MD  DATE OF BIRTH:  Dec 21, 1933   DATE OF ADMISSION:  01/26/2006  DATE OF DISCHARGE:  02/02/2006                                 DISCHARGE SUMMARY   IDENTIFYING DATA AND REASON FOR ADMISSION:  The patient is a 77 year old  married retired male who agreed to admission at the behest of his family  members, because of their concern about his alcohol use.  Please refer to  the admission note for further details pertaining to the symptoms,  circumstances and history that led to his hospitalization.  He was given an  initial Axis I diagnosis of alcohol dependence.   MEDICAL AND LABORATORY:  The patient was medically and physically assessed  by the psychiatric nurse practitioner.  He came to [place] with a history of  hypertension.  He was continued on Zocor 40 mg daily, Lotensin 20 mg daily,  hydrochlorothiazide 12.5 mg daily, and also given his usual Timoptic eye  drops.   Admission laboratory showed mild increase in hepatic enzymes, but normal  bilirubins.  His blood alcohol on admission had been 0.23.  There were no  acute medical issues during this inpatient stay, aside from his medical  detoxification from alcohol.   HOSPITAL COURSE:  The patient was admitted to the adult inpatient  psychiatric service.  He presented as a thin, unhealthy-appearing man who  was rather frail and moved slowly.  He appeared cognitively quite fuzzy, but  did not appear to have overt signs or symptoms of dementia or organicity.  He was initially quite defensive about his alcohol use, and asked when he  could go home.  He initially stated that he did not know why he was in the  hospital but my wife wanted me to get checked out.  There is nothing  wrong with me.   The patient was placed on a Librium  detoxification protocol.  Also, he was  started on trial of Lexapro 10 mg daily for depressive symptoms.   Throughout his hospitalization, the undersigned as well as other staff had  several conversations with him about his alcohol use.  He consistently  minimized his alcohol use, and one point, stating I only had three little  drinks prior to admission, which was not sufficient to account for the  blood alcohol level of 0.23.  He was confronted about his elevated liver  enzymes, and his alcohol level on admission, and he appeared to gloss over  this.  He insisted that he did not have a drinking problem because he tended  to his responsibilities.   He proceeded with the Librium detoxification protocol.  He was somewhat  sleepy due to Librium, but because of it he did not appear to demonstrate  any tremulousness.  His appetite was reasonably good as was his sleep.   On the sixth hospital day there was a family session involving the patient,  his wife, and his adult daughter.  In that meeting he stated that he does  not plan to drink ever again in his life.  The daughter and wife confronted  the patient about his drinking and stated that they insisted on him going to  an inpatient alcohol program.  The daughter emphasized that he needed  further help and insisted that he go.  He refused to go for inpatient  alcohol treatment, stating that he could stop drinking on his own.  Wife  reported that the patient is verbally abusive when he is drinking, and she  refuses  to live with him in the same home and he does not get further  treatment.  The patient's wife reported that the patient drinks daily and he  threatens her and their grandson.  The patient denied drinking daily.  The  patient's daughter produced pictures of his empty alcohol bottles.  The wife  and daughter stated to the patient that if he did not get treatment the wife  and grandson would leave the family home.  The patient  reiterated that he  would not go to another hospital or treatment center.  He stated he would  rather hurt himself before he would go to another inpatient program.  He  stated that he does not have a problem with alcohol and needed to have peace  in his life.   The day following this meeting, the undersigned discussed the patient's  situation with him further.  He again indicated that he could stop drinking  on his own and did not need to go to on alcohol treatment program.  He  reported that he felt well physically, was non tremulous, and by this point  he was finished with the Librium withdrawal protocol.   Case management spoke with the patient as well about his options.  The  patient told the case manager that he would go home to stop drinking.  However, he left the opening that if his primary care physician directed him  to go to an inpatient alcohol treatment center, that he might actually go.  The case manager spoke with that doctor., who indicated that he had  previously directed the patient to go to an inpatient alcohol program.  The  patient did agreed to sign a release of information for [facility], a  local treatment program.  However, the patient indicated that he will be  going home.   The patient was discharged at that point, as his detoxification course was  complete, and there was no basis for further inpatient psychiatric  hospitalization.  At the time of his discharge, it was not clear whether or  not he would elect to go to an inpatient alcohol program such as [facility].   AFTERCARE:  The patient was to follow-up with his family medical doctor for  medical issues.  The patient also had contact information for [facility].   DISCHARGE MEDICATIONS:  Zocor 40 mg daily, Lotensin 20 mg daily,  hydrochlorothiazide 12.5 mg daily, Lexapro 10 mg daily, and Timoptic eye  drops.  DISCHARGE DIAGNOSES:  AXIS I: Alcohol dependence, chronic, severe,   depressive disorder not otherwise specified, versus substance-induced mood  disorder.  AXIS II: Deferred.  AXIS III: History of hypertension.  AXIS IV: Stressors severe.  AXIS V: Global assessment of functioning on discharge 55.      Anselm Jungling, MD  Electronically Signed     SPB/MEDQ  D:  02/04/2006  T:  02/04/2006  Job:  [id number]

## 2010-10-27 NOTE — Procedures (Signed)
[place]  Patient:    [name], [name] Visit Number: [id number] MRN: [id number]          Service Type: DSU Location: [facility] 2891 01 Attending Physician:  Bennye Alm Dictated by:   Di Kindle Edilia Bo, M.D. Proc. Date: 05/19/01 Admit Date:  05/19/2001   CC:         Gordy Savers, M.D.  [facility], [facility] C. Eden Emms, M.D. [facility]   Procedure Report  PREOPERATIVE DIAGNOSIS:  Abdominal aortic aneurysm.  POSTOPERATIVE DIAGNOSIS:  Abdominal aortic aneurysm.  PROCEDURE:  Aortogram, bilateral iliac arteriogram, bilateral lower extremity runoff.  SURGEON:  Di Kindle. Edilia Bo, M.D.  INDICATIONS:  This is a 77 year old gentleman who was found by Dr. Amador Cunas to have a pulsatile abdominal mass.  This prompted an ultrasound which showed a 5-cm infrarenal abdominal aortic aneurysm.  He was sent over for evaluation, and angiogram was recommended in order to plan elective repair of this aneurysm.  The procedure and potential complications including but not limited to bleeding, arterial injury, dye reaction, and kidney failure were discussed with the patient preoperatively.  All questions were answered, and he wished to proceed.  TECHNIQUE:  The patient was brought to the [facility] Lab at [facility] and sedated with a mg of Versed and 50 mcg of fentanyl.  Both groins were prepped and draped in the usual sterile fashion.  After the skin was anesthetized with 1% lidocaine, the right common femoral artery was cannulated, and the guide wire introduced into the infrarenal aorta.  A 5-French sheath was placed over the wire.  A pigtail catheter was then positioned at the L1 vertebral today and flush aortogram obtained.  A lateral projection of the aorta was also obtained.  The catheter was then repositioned above the aortic bifurcation, and iliac projections were obtained.  Bilateral lower extremity runoff films were  then obtained.  FINDINGS:  There are single renal arteries bilaterally with no significant renal artery stenosis identified.  The patient appears to have an approximately 2-2.5 cm infrarenal neck before the aneurysm starts.  There is some angulation of the neck.  The aneurysm ends above the bifurcation, and there does not appear to be any significant aneurysmal disease of the common iliac arteries bilaterally.  Lateral projection demonstrates that the superior mesenteric artery and celiac axis are widely patent.  The lateral projection confirms that the patient has an approximately 2-cm infrarenal neck.  Of note, the inferior mesenteric artery is not visualized.  There is some very mild dilatation of the distal right common iliac artery, but it is not aneurysmal.  Both hypogastric arteries are patent and both external iliac arteries are patent.  The common femoral, superficial femoral, and deep femoral arteries are patent bilaterally.  Both popliteal arteries are patent.  There is three-vessel runoff bilaterally with no significant tibial occlusive disease noted.  There is poor visualization of the tibial vessels distally.  CONCLUSIONS:  Infrarenal abdominal aortic aneurysm with an approximately 2-cm infrarenal neck.  No significant common iliac artery aneurysms.  No significant tortuosity of angulation of the iliacs which would prohibit endovascular repair.  There is some angulation of the proximal aortic neck. Based on this study, this patient may be a candidate for endovascular repair of his aneurysm. Dictated by:   Di Kindle Edilia Bo, M.D. Attending Physician:  Bennye Alm DD:  05/19/01 TD:  05/19/01 Job: [id number]

## 2010-10-27 NOTE — Discharge Summary (Signed)
[place]  Patient:    TAMEEM, PULLARA Visit Number: [id number] MRN: [id number]          Service Type: SUR Location: 3300 3314 01 Attending Physician:  Bennye Alm Dictated by:   Tollie Pizza Collins, P.A.-C. Admit Date:  07/01/2001 Discharge Date: 07/03/2001   CC:         Gordy Savers, M.D. [facility]             Madolyn Frieze. Jens Som, M.D. [facility]                           Discharge Summary  PRIMARY ADMISSION DIAGNOSIS:  Abdominal aortic aneurysm.  ADDITIONAL DIAGNOSES: 1. Hypertension. 2. Glaucoma. 3. Chronic obstructive pulmonary disease, chronic bronchitis.  PROCEDURE:  Endovascular repair of abdominal aortic aneurysm with Aneur-X.  HISTORY OF PRESENT ILLNESS:  The patient is a 77 year old white male who was found on recent routine physical examination performed by Dr. Eleonore Chiquito to have a palpable pulsatile midline abdominal mass.  An ultrasound was performed and showed a 5 cm abdominal aortic aneurysm.  He was referred to Dr. Edilia Bo for further evaluation.  A CT scan was ordered.  This confirmed a 5.7 cm abdominal aortic aneurysm.  The stent graft protocol was performed and it was determined that the patient was an appropriate candidate for endovascular stent graft repair of the aneurysm.  He was seen in follow up in the office by Dr. Edilia Bo and discussion was entertained regarding the risks and benefits of endovascular versus open repair and it was elected that the patient would proceed with stent graft repair.  He was seen preoperatively by Dr. Jens Som for cardiac clearance and was given an okay for surgery.  HOSPITAL COURSE:  The patient was admitted on 07/01/2001 and underwent endovascular stent graft repair of his abdominal aortic aneurysm.  The patient tolerated the procedure well and was transferred from the PACU to the floor in stable condition.  Postoperatively the patient has done well.  He has  remained afebrile and all vital signs have been stable.  He is tolerating a regular diet and is having normal bowel and bladder function.  His pain is well controlled. He is ambulating in the halls without difficulty.  He has been weaned off supplemental oxygen.  It is felt that if he continues to remain stable he will be ready for discharge home on 07/03/2001.  DISCHARGE MEDICATIONS:  Patient is to continue his home medications which include: 1. Timolol ophthalmic drops 0.5% daily. 2. Xalatan 0.005% ophthalmic drops daily. 3. Lotensin/HCTZ 20/12.5 one q.d. 4. Multivitamins daily. 5. Patient is also given prescription for Darvocet-N 100 one to two q.4h prn    for pain.  DISCHARGE INSTRUCTIONS:  Patient is asked to refrain from driving, heavy lifting or any strenuous activity.  The patient should continue daily walking and incentive spirometry exercises.  He is asked to continue a low fat, low sodium diet.  He may shower daily and clean his incision site with soap and water.  He will follow up with Dr. Edilia Bo in the office in three weeks and is asked to call if he has any problems in the interim. Dictated by:   Tollie Pizza Collins, P.A.-C. Attending Physician:  Bennye Alm DD:  07/02/01 TD:  07/03/01 Job: [id number]

## 2010-10-27 NOTE — Discharge Summary (Signed)
[name], [name]                ACCOUNT NO.:  [id number]   MEDICAL RECORD NO.:  [id number]          PATIENT TYPE:  IPS   LOCATION:  0504                          FACILITY:  [facility]   PHYSICIAN:  Geoffery Lyons, M.D.      DATE OF BIRTH:  1934/05/19   DATE OF ADMISSION:  05/14/2006  DATE OF DISCHARGE:  05/21/2006                               DISCHARGE SUMMARY   ADMITTING DIAGNOSES:   AXIS I:  1. Alcohol dependence.  2. Depressive disorder not otherwise specified.   AXIS II:  No diagnosis.   AXIS III:  1. Hypertension.  2. Glaucoma.  3. Increased liver enzymes.   AXIS IV:  Moderate.   AXIS V:  Upon admission 25; his GAF in the last year is 60.   DISCHARGE DIAGNOSES:   AXIS I:  1. Alcohol dependence.  2. Depressive disorder not otherwise specified.   AXIS II:  No diagnosis.   AXIS III:  1. Hypertension.  2. Glaucoma.  3. Increased liver enzymes.   AXIS IV:  Moderate.   AXIS V:  Upon discharge 50.   CHIEF COMPLAINT AND PRESENT ILLNESS:  This was one of several admissions  to [facility] for this 77 year old, separated, white  male, brought to the emergency room after his family found him at home  covered in feces and intoxicated.  Alcohol level was 259.  History of  abusing large amounts of alcohol and has resumed daily drinking.  Also,  recently adjudicated incompetent.  Sister-in-law is the guardian.  Made  the statement that he wanted to be left alone and drink until he died.  Many stressors, the fact that his dog died three days prior to this  admission and felt possibly the neighbor was guilty of killing the dog.  Apparently within the past six months, his wife left him because he  refused to stop drinking.  Endorsed sadness, some suicidal thoughts, no  specific plans.  He relapsed when he got down in the dumps, endorsed  that he could not handle his depression anymore.   PAST PSYCHIATRIC HISTORY:  He has been detoxed before, the last time  was  in 2007, resumed drinking shortly after that.  While intoxicated,  history of aggressive, threatening behavior.  This has been treated with  Lexapro.   ALCOHOL AND DRUG HISTORY:  As already stated, present continuous use of  alcohol, no other substances.   MEDICAL HISTORY:  1. Hypertension.  2. COPD.  3. Glaucoma.  4. Dyslipidemia previously on Zocor.  5. An aortic aneurysm repair.   MEDICATIONS:  1. Timolol drops.  2. Lexapro 10 mg per day.  3. Benazepril 20/12.5.  4. Hydrochlorothiazide daily.  5. Zocor 40 mg per day.  6. Thiamine 100 mg per day.  7. Folic acid 1 mg per day.   Physical exam performed failed to show any acute findings.   LABORATORY WORKUP:  CBC:  White blood cells 4.1, hemoglobin 15.9,  platelets 196,000, mean corpuscular volume 99.8.  Blood chemistry:  Sodium 144, potassium 4.5, glucose 103.  Liver enzymes:  SGOT 83, SGPT  67.  Urine drug screen was negative for substances of abuse.  Alcohol  level in the ED 259.   MENTAL STATUS EXAM:  An alert, cooperative male, somewhat blunted  affect.  Speech was normal in rate and in production, fairly articulate.  Ruminating about he got depressed and how much he misses his dog.  He  was clear about endorsing depression and unable to control his drinking.  Thought processes were logical, coherent, and relevant.  No evidence of  delusions, no suicidal ideations or plans, no homicidal ideas, no  hallucinations.  Cognition well preserved.   COURSE IN THE HOSPITAL:  He was admitted.  He was started in the  individual and group psychotherapy.  He was maintained on his  medications.  He was detoxified with Librium, placed on Benazepril  20/12.5 mg per day, Zocor was held due to his increased liver enzymes,  maintained on the Timolol ophthalmic drops, Prevacid 40 mg per day  changed to Protonix 40 mg per day.  As already stated, a 77 year old  male, alcohol dependence, found in his house passed out, covered in   feces, not able to take care of himself.  He was taken by the sister-in-  law to the ED.  Endorsed that he drinks some, having a difficult time  after his wife left, but he tended to minimize, denies, and it was  confirmed that the sister-in-law is the legal guardian, and he was  deemed to be incompetent.  She was requesting assisted living placement.  The sister stated that he binges a half gallon of liquor and retires to  his bedroom to drink, concerned that he will drink himself to death.  He endorsed that he did trust the sister-in-law, but still minimizes  alcohol use, claimed that he drank one to two drinks a night, did not  express concern about his drinking, does not feel that there is anything  wrong with it.  Able to open up and share the fact that he was upset  with his wife that left after 50 years.  Felt that he was not able to  get on with his life.  Endorsed anxiety and wanting to leave the  hospital as soon as possible.  We worked on a safe medical detox, worked  on relapse prevention, coping skills, and grief loss.  We worked with  him in increasing the awareness of alcohol as being a problem and the  need to abstain.  The detoxification went uneventfully, initially some  shakes and tremors.  He started sleeping better.  The first few days, he  slept quite a bit, but he endorsed that he had some catching up to do.  His cognition continued to improve.  Family session on December the 9th.  He wanted to pursue followup once discharged, and the patient's brother  was going to take him with him and work on selling the house.  The plan  was for him to go with the brother and sister in [place].  The family  was very supportive.  The brother claimed that no one in the house drank  and that he was going to be around no alcohol.  The patient did express  some negative feelings towards the man that he thought killed his dog. By the 10th, still endorsing that he was upset with a  neighbor, who he  said killed his dog.  Endorsed that he would never want to kill the  neighbor but would not mind walking and  beating him but said that he  will call the police rather than him dealing with the neighbor directly.  Overall, he seemed to be quite stable.  There was no evidence of overt  withdrawal.  By December the 11th, he was in full contact with reality.  His mood was improved, affect was bright, and no evidence of withdrawal.  No major mood fluctuation.  He was willing and motivated to pursue  further outpatient treatment for awhile.  He was discharged to  outpatient followup.   DISCHARGE MEDICATIONS:  1. Zocor 20 mg per day.  2. Benazepril 20/12.5 mg per day.  3. Folic acid 1 mg per day.  4. Timoptic 0.5 ophthalmic solution twice a day.  5. Protonix 40 mg per day.  6. Lexapro 10 mg per day.   FOLLOWUP:  [facility].      Geoffery Lyons, M.D.  Electronically Signed     IL/MEDQ  D:  06/08/2006  T:  06/08/2006  Job:  [id number]

## 2010-10-27 NOTE — Discharge Summary (Signed)
[name], [name]                ACCOUNT NO.:  [id number]   MEDICAL RECORD NO.:  [id number]          PATIENT TYPE:  INP   LOCATION:  1530                         FAC[facility]   PHYSICIAN:  Velora Heckler, MD      DATE OF BIRTH:  Jun 19, 1933   DATE OF ADMISSION:  01/29/2008  DATE OF DISCHARGE:  01/31/2008                               DISCHARGE SUMMARY   REASON FOR ADMISSION:  Ventral incisional hernia.   HISTORY OF PRESENT ILLNESS:  [name] is a 77 year old white male  from [place], [place].  The patient has a ventral incisional  hernia of several months' duration following abdominal aortic aneurysm  repair by Dr. Waverly Ferrari.  The patient comes to surgery for  repair.   HOSPITAL COURSE:  The patient was admitted on the surgical service and  taken to the operating room on January 29, 2008.  He underwent  laparoscopic ventral incisional hernia repair with polyester mesh.  Postoperative course was uncomplicated.  The patient required Toradol  and narcotics for pain control.  The patient was reassessed on  postoperative day #2 and was doing well.  Mental status had improved.  Abdomen was soft.  The patient was discharged home.   DISCHARGE PLANNING:  The patient was discharged home on January 31, 2008  in good condition, tolerating a regular diet, and ambulating  independently.  The patient was taking Vicodin as needed for pain.  He  will wear an abdominal binder at all times.  He will be seen back at  [facility] for wound check in 2 weeks.   FINAL DIAGNOSIS:  Ventral incisional hernia.   CONDITION AT DISCHARGE:  Good.      Velora Heckler, MD  Electronically Signed     TMG/MEDQ  D:  03/15/2008  T:  03/15/2008  Job:  [id number]

## 2010-10-27 NOTE — Letter (Signed)
Oct 29, 2006     RE:  CURREN, MOHRMANN  MRN:  [id number]  /  DOB:  09/02/1933   To whom it may concern:   Mr. Claire Bridge is a patient followed in our internal medicine  practice.  This gentleman is 77 years old and has previously been  determined legally to be incompetent and has been assigned a  guardianship.  At the present time, Mr. Coutant is doing quite well, is  clinically stable, and is able to manage his own affairs without  assistance.  His medical status has been quite stable and he is able to  perform all aspects of daily living without intervention, and is able to  live independently.  At the present time, Mr. Buchta has no activity  restrictions.   If further details are required, please do not hesitate to contact this  office.    Sincerely,      Gordy Savers, MD  Electronically Signed    PFK/MedQ  DD: 10/29/2006  DT: 10/29/2006  Job #: [id number]

## 2010-10-27 NOTE — H&P (Signed)
NAMEJERIMAH, [name]                ACCOUNT NO.:  [id number]   MEDICAL RECORD NO.:  [id number]          PATIENT TYPE:  INP   LOCATION:  NA                           FACILITY:  [facility]   PHYSICIAN:  Di Kindle. Edilia Bo, M.D.DATE OF BIRTH:  June 24, 1933   DATE OF ADMISSION:  10/02/2005  DATE OF DISCHARGE:                                HISTORY & PHYSICAL   PRIMARY CARE PHYSICIAN:  Gordy Savers, M.D. [facility]   CARDIOLOGIST:  Olga Millers, M.D. [facility]   CHIEF COMPLAINT:  Shift of AneuRx stent graft.   HISTORY OF PRESENT ILLNESS:  This is a 77 year old Caucasian male who is  status post abdominal aortic aneurysm repair with an AneuRx stent graft in  January of 2003.  The patient has been followed with serial six-month exams  with no change in aneurysm size.  There is, however, a kink where an iliac  limb overlaps the bifurcated component of his stent graft.  His most recent  CT scan shows continued progression of the kink.  The proximal aspect of the  graft has also slipped down well below the renal arteries.  The patient does  have chronic back pain but no new symptoms.  There is no evidence of an  endoleak now; however, the patient is at increased risk, and therefore, Dr.  Edilia Bo wishes to proceed with an open removal of the graft and repair of  abdominal aortic aneurysm.  Cardiac clearance has been obtained from [facility]  prior to proceeding with surgery.  The patient denies abdominal pain,  nausea, vomiting, constipation, hematochezia, hematemesis, peripheral edema,  dysuria, hematuria, reflux symptoms, angina, palpitations, an TIA/CVA  symptoms.  The patient does have mild claudication symptoms which he  complains of pain behind his knees after walking on an incline for  approximately 20 to 30 minutes.  These do resolve quickly with rest.   PAST MEDICAL HISTORY:  1.  Abdominal aortic aneurysm.  2.  Hypertension.  3.  Alcohol abuse.  The patient just completed treatment at [facility] last week.  4.  Glaucoma in the left eye.  5.  COPD.  6.  Hyperlipidemia.  7.  Current tobacco abuse.   PAST SURGICAL HISTORY:  1.  AneuRx stent graft repair of abdominal aortic aneurysm in January of      2003.  2.  Pilonidal cyst removal.   ALLERGIES:  SULFA WHICH CAUSES A RASH, ACTIFED WHICH CAUSES NAUSEA AND  VOMITING.   MEDICATIONS:  The patient is to bring a list of medications to the hospital,  as he is currently unable to give that information.   REVIEW OF SYSTEMS:  Please see HPI for significant positives and negatives;  otherwise, negative for cardiac disease, kidney disease, and diabetes  mellitus.   SOCIAL HISTORY:  This is a married male with three children who lives with  his family.  He currently smokes one pack per day and has smoked for 50  years.  The patient, until last week, drank approximately one gallon of  Vodka per day per family report.  FAMILY HISTORY:  Mother and father are both significant for hypertension and  stroke.   PHYSICAL EXAMINATION:  VITAL SIGNS:  Blood pressure 100/70 in the left arm  sitting, heart rate 80, respirations 18.  GENERAL:  This is a 77 year old Caucasian male in no acute distress.  HEENT:  Normocephalic, atraumatic.  Pupils equal, round, reactive to light  and accommodation.  Extraocular movements are intact.  Oral mucosa is pink  and moist.  The sclerae are anicteric, and he does have complete upper and  lower dentures.  NECK:  Supple with no JVD and no bruits.  No carotids are palpable.  LUNGS:  Respirations are symmetric with inspiratory wheezes auscultated  bilaterally.  CARDIAC:  Regular rate and rhythm with no murmurs, rubs, or gallops.  ABDOMEN:  Soft, nontender, nondistended, with normoactive bowel sounds.  GENITORECTAL:  Deferred.  EXTREMITIES:  There is no edema or varicosities noted.  The bilateral lower  extremities are warm.  Pulses radial, femoral, and popliteal are 2+  bilaterally.   Pedal pulses are 1+ bilaterally.  NEUROLOGIC:  Nonfocal.  The patient is alert and oriented x3.  The gait is  steady, and muscle strength is 5/5 throughout all extremities and symmetric.  The deep tendon reflexes are 2+.   ASSESSMENT:  Shift of AneuRx stent graft.   PLAN:  Removal of stent graft and open repair of abdominal aortic aneurysm  on October 02, 2005 by Dr. Edilia Bo.  Dr. Edilia Bo has seen and evaluated the  patient prior to this admission and has explained the risks and benefits of  the procedure, and the patient has agreed to continue.      Pecola Leisure, PA      Di Kindle. Edilia Bo, M.D.  Electronically Signed    AY/MEDQ  D:  09/27/2005  T:  09/28/2005  Job:  [id number]   cc:   Di Kindle. Edilia Bo, M.D.  [address]  [place]  [place] [address]

## 2010-10-27 NOTE — Op Note (Signed)
[name], [name]                ACCOUNT NO.:  [id number]   MEDICAL RECORD NO.:  [id number]          PATIENT TYPE:  INP   LOCATION:  6715                         FACILITY:  [facility]   PHYSICIAN:  Georgina Quint. Plotnikov, M.D. LHCDATE OF BIRTH:  Oct 07, 1933   DATE OF PROCEDURE:  DATE OF DISCHARGE:  09/22/2005                                 OPERATIVE REPORT   DISCHARGE DIAGNOSES:  1.  Alcohol withdrawal/detoxification.  2.  Depression.  3.  Status post aortic stent graft.  He will probably need open abdominal      aortic aneurysm repair and exploration of the graft next Thursday.  4.  Chronic low back pain.  5.  Hypertension.  6.  Chronic obstructive pulmonary disease.   CONSULTATIONS:  Dr. Edilia Bo, CVCS, and Dr. Jeanie Sewer, psychiatry.   HISTORY:  The patient is a 77 year old male who was admitted by Dr.  Amador Day on April 11, 2007__________ prior to surgery.  For the details,  please address his history and physical.   COURSE OF HOSPITALIZATION:  During the course of hospitalization, the  patient has done well.  He was maintained on Librium, __________, and  Lexapro.  Psychiatry consultation was obtained.  Dr. Edilia Bo saw him as well  and discussed surgery after discharge.  On the day of discharge, he was  feeling well.  He was in no acute distress.  O2 sats 95% on room air.  Temp  97.8.  Weight 85 pounds.  Respirations 20.  Blood pressure 130/86.  He is in  no acute distress.  Lungs clear.  Decreased breath sounds.  Heart:  S1, S2.  No tachycardia.  Abdomen:  Soft, nontender.  He is alert, oriented, and  cooperative.  No tremor.  Neuro exam nonfocal, normal.  Labs today:  His  DMET is normal with a potassium of3.7.  CBC is normal with hemoglobin of  15.8.  His ALT was elevated at 51 once.  Chest x-ray with normoactive lung  disease.   DISCHARGE MEDICINES:  Librium 25 mg twice daily, vitamin B1 at 100 mg daily,  Lexapro 10 mg daily, Vicodin 5/500 four times a day as needed for  pain.  Resume other home medicines.   FOLLOW-UP PLAN:  Dr. Edilia Bo, he will call next week, and Dr. Amador Day in  one to two weeks.   SPECIAL INSTRUCTIONS:  Stop Ativan.  Take Librium instead.           ______________________________  Georgina Quint. Plotnikov, M.D. [facility]     AVP/MEDQ  D:  09/22/2005  T:  09/23/2005  Job:  [id number]   cc:   Di Kindle. Edilia Bo, M.D.  [address]  [place]  [place] [address]   Gordy Savers, M.D. [facility]  [address] [place]  [place] [address]

## 2010-10-27 NOTE — Discharge Summary (Signed)
NAMEJACQUISE, [name]                ACCOUNT NO.:  [id number]   MEDICAL RECORD NO.:  [id number]          PATIENT TYPE:  INP   LOCATION:  2033                         FACILITY:  [facility]   PHYSICIAN:  Di Kindle. Edilia Bo, M.D.DATE OF BIRTH:  1933/09/21   DATE OF ADMISSION:  10/02/2005  DATE OF DISCHARGE:  10/10/2005                                 DISCHARGE SUMMARY   PRIMARY ADMITTING DIAGNOSIS:  Status post AneuRx stent graft repair of  abdominal aortic aneurysm with migration of graft.   ADDITIONAL/DISCHARGE DIAGNOSES:  1.  Status post AneuRx stent graft of abdominal aortic aneurysm with      migration of graft.  2.  Hypertension.  3.  History of alcohol abuse.  4.  Glaucoma.  5.  Chronic obstructive pulmonary disease.  6.  Hyperlipidemia.  7.  Tobacco abuse.  8.  Postoperative confusion secondary to mild postoperative delirium and      alcohol withdrawal.   PROCEDURES:  1.  Explantation of AneuRx stent graft.  2.  Open repair of abdominal aortic aneurysm with 18 x 9 mm Hemashield      aortobi-iliac graft.   HISTORY OF PRESENT ILLNESS:  The patient is a 77 year old male who  previously underwent repair of an abdominal aortic aneurysm in January of  2003 with an AneuRx stent graft.  He has been followed with serial  surveillance studies and recently is noted to have some migration of the  proximal aspect of the graft with some kinking where the bifurcating  component and the iliac limb overlapped.  Because of this, Dr. Edilia Bo was  concerned that an endovascular leak could occur given that there would not  be an adequate seal proximally and would increase his risk of aneurysm  expansion and rupture.  He was not a candidate for further endovascular  intervention.  Therefore, Dr. Edilia Bo recommended proceeding with an open  aneurysm repair and explantation of the graft.   He explained the risks, benefits, and alternatives of the procedure to the  patient and his family and  they agreed to proceed with surgery.   HOSPITAL COURSE:  [name] was admitted to [facility] on October 02, 2005 and underwent explantation of a stent graft with open abdominal  aortic aneurysm repair as described in detail above performed by Dr.  Edilia Bo.  He tolerated the procedure well and was transferred to the SICU in  stable condition.  He was able to be extubated postoperatively in the  operating room.  On postoperative day one, he was hemodynamically stable and  doing well.  At that time, he was felt to be ready for transfer to the  floor.   His major postoperative complication has been confusion.  He was initially  treated conservatively and monitored closely.  However, his family felt that  his confusion was concerning and therefore CT of the head was obtained which  was negative for any acute abnormalities.  It was felt that his confusion  was most likely secondary to postoperative delirium as well as alcohol  withdrawal and he was placed on alcohol withdrawal  protocol and continued to  be monitored.  His confusion is slowly improving and he is back at baseline  at this point with only intermittent episodes of confusion.  From a GI  standpoint, his bowel function has slowly returned and at the present his  diet has been advanced to a regular soft diet which he is tolerating well.  He is having normal bowel and bladder function.  He has otherwise done well.  He is ambulating the halls independently and doing well.  His incisions are  all healing well.  He is afebrile and all vital signs are stable.  His O2  saturations are greater than 90% on room air.  His abdomen is soft,  nontender and has active bowel sounds.  His feet are well perfused.  His  lungs are clear.  His heart is regular rate and rhythm.   His most recent labs on October 08, 2005 showed a hemoglobin of 10, hematocrit  30, white count 5, platelets 245,000.  Sodium 137, potassium 3.8, BUN 9,  creatinine  0.7.  Arrangements have been made for home health nurse as well  as home health PT and OT.  At this point, he is medically stable and ready  for discharge home.   DISCHARGE MEDICATIONS:  1.  Tylox 1-2 q.4h. p.r.n. pain.  2.  Nicoderm CQ 14 mg daily.  3.  Folic acid 1 mg daily.  4.  Lexapro 10 mg daily.  5.  Lipitor 20 mg daily.  6.  Benazepril/HCT 20/12.5 daily.  7.  Librium 25 mg b.i.d.  8.  Thiamine 100 mg daily.  9.  Timolol eye drops daily as directed.   DISCHARGE INSTRUCTIONS:  He is asked to refrain from driving, heavy lifting  or strenuous activity.  He may continue ambulating daily and using his  incentive spirometer.  He may shower daily and clean his incisions with soap  and water.  He will continue soft regular diet.   FOLLOW UP:  A home health nurse has been arranged for follow-up and we will  remove his skin staples in one week.  He will also have home health PT and  OT services.  He will see Dr. Edilia Bo back in the office on Oct 31, 2005 at  12:50 p.m.  He will contact our office in the interim if he experiences any  problems or has questions.      Coral Ceo, P.A.      Di Kindle. Edilia Bo, M.D.  Electronically Signed    GC/MEDQ  D:  10/10/2005  T:  10/10/2005  Job:  [id number]   cc:   Gordy Savers, M.D. [facility]  [address] [place]  [place] [address]   Olga Millers, M.D. [facility]  [address]  [address]  [place]  [place] [address]
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
## 2010-10-27 NOTE — Consult Note (Signed)
NAMEGAVAN, NORDBY                ACCOUNT NO.:  [id number]   MEDICAL RECORD NO.:  [id number]          PATIENT TYPE:  INP   LOCATION:  6715                         FACILITY:  [facility]   PHYSICIAN:  Antonietta Breach, M.D.  DATE OF BIRTH:  05-Nov-1933   DATE OF CONSULTATION:  09/21/2005  DATE OF DISCHARGE:  09/22/2005                                   CONSULTATION   REQUESTING PHYSICIAN:  Gordy Savers, M.D.   REASON FOR CONSULTATION:  Alcohol dependence and depression.   HISTORY OF PRESENT ILLNESS:  Mr. Hack is a 77 year old male admitted for  endovascular repair of an abdominal aortic aneurysm.  The patient presented  with tremors, sweats, undergoing withdrawal from alcohol.  He was started on  a Librium regimen.  As an outpatient, a Librium detox was tried, but he  continued to drink on the Librium.   Currently, his tremors are mild.  There is no nausea or vomiting.  The  patient seems to have low insight into his alcohol problem, it's no trouble  to quit drinking.  He states that his wife wants him to quit.  Patient does  acknowledge that he has been drinking more alcohol since he retired, and he  misses work.  His mood is depressed.  His energy is decreased.  His  concentration is reduced.  There are no thoughts of harming himself.  No  thoughts of harming others.  There are no hallucinations or delusions.  His  sleep is within normal limits.   The patient acknowledges that he has been depressed with death of his  siblings and retired, it's hit me pretty hard.   PAST PSYCHIATRIC HISTORY:  No history of psychiatric care.  No history of  psychotropics.  No suicide attempts.  No mania.  No hallucinations or  delusions.   FAMILY PSYCHIATRIC HISTORY:  None known.   SOCIAL HISTORY:  The patient retired one year prior to admission.  He has  been married for 40 years.  He has three children.  He was a Naval architect.  His religion is [employer].  He does not use any illegal drugs.   Regarding  alcohol, he started drinking alcohol at age 89 and has gone for six months  sober at one time.  He states that he drinks about a fifth every week to two  weeks.  His last drink, he states, was one week prior to today.  He has no  history of rehabilitation.  He denies a history of DTs, blackouts, or  seizures.   GENERAL MEDICAL PROBLEMS:  1.  Abdominal aortic aneurysm.  2.  Hyperlipidemia.   MEDICATIONS:  The MAR is reviewed.  Psychotropics include the Librium  withdrawal protocol.   LABORATORY DATA:  AST 36, ALT 51.  MCV is elevated at 100.2.   REVIEW OF SYSTEMS:  CONSTITUTIONAL:  No fever.  HEAD:  No trauma.  EYES:  No  visual changes.  The patient has glaucoma.  EARS:  No hearing impairment.  NOSE:  No rhinorrhea.  THROAT:  No sore throat.  CARDIOVASCULAR:  The  patient has  a history of hypertension.  RESPIRATORY:  The patient has a  history of COPD.  GASTROINTESTINAL:  No nausea, vomiting, or diarrhea.  MUSCULOSKELETAL:  No deformities, weaknesses, or atrophies.  SKIN:  Unremarkable.  NEUROLOGIC:  Unremarkable.  PSYCHIATRIC:  As above.  ENDOCRINE:  Unremarkable.  HEMATOLOGIC/LYMPHATIC:  As above.  ALLERGIES:  No  known drug allergies.   PHYSICAL EXAMINATION:  VITAL SIGNS:  Temperature 97.2, pulse 80,  respirations 20, blood pressure 114/75.  O2 saturation on room air is 93%.  MENTAL STATUS EXAM:  Mr. Chamberland is an alert, socially appropriate elderly  male.  His fund of knowledge and intelligence are average.  He is oriented  to the self, situation, and the year.  He thinks that it is February 12th.  His speech is slightly flat.  Normal rate.  Mood is depressed.  Affect is  constricted.  Thought process:  Logical, coherent, goal-directed.  No  looseness of associations.  Concentration is mildly decreased.  Thought  content:  No thoughts of harming himself.  No thoughts of harming others.  No delusions.  No hallucinations.  Memory testing:  Immediate 3/3.  On  recall  1/3.  Insight is partial.  Judgment is overall intact.   ASSESSMENT:   AXIS I:  1.  Mood  disorder, not otherwise specified (provisional with general      medical, substance induced, and reactive components).  2.  Alcohol dependence.   AXIS II:  None.   AXIS III:  See general medical problems.   AXIS IV:  1.  Grief.  2.  General medical.   AXIS V:  55.   RECOMMENDATIONS:  1.  Discussed Lexapro with the patient.  He would like to have Lexapro for      depression.  Recommend starting Lexapro at 10 mg daily for      antihypertensive.  2.  Continue vitamins as an outpatient.  3.  The patient is not interested in alcohol rehabilitation.  He does not      see himself as having a problem.  Twelve step information was given;      however, the patient is not interested.  Outpatient psychiatric clinic      options include [facility], [facility], and      [facility].  If the patient changes his mind about chemical      dependency rehabilitation, call any of these clinics for recommendations      within the area.      Antonietta Breach, M.D.  Electronically Signed     JW/MEDQ  D:  12/10/2005  T:  12/10/2005  Job:  [id number]

## 2010-10-27 NOTE — H&P (Signed)
[place]. [facility]  Patient:    [name], [name] Visit Number: [id number] MRN: [id number]          Service Type: DSU Location: [facility] 2891 01 Attending Physician:  Bennye Alm Dictated by:   Loura Pardon, P.A. Admit Date:  05/19/2001 Discharge Date: 05/19/2001                           History and Physical  DATE OF BIRTH:  09-15-1933  PRIMARY CARE PHYSICIAN:  Gordy Savers, M.D.  CARDIOLOGIST:  Madolyn Frieze. Jens Som, M.D.  PRESENTING CIRCUMSTANCE:  "I have an abdominal aortic aneurysm."  HISTORY OF PRESENT ILLNESS:  [name] is a 77 year old male who has an abdominal aortic aneurysm detected by his primary caregiver, Dr. Amador Cunas. He has no symptoms relatable to this condition.  He has no history of lower extremity claudication, no history of embolic events.  He underwent aortogram, bilateral iliac arteriogram, and bilateral lower extremity runoff on May 19, 2001.  The study demonstrates an infrarenal abdominal aortic aneurysm with 2 cm infrarenal neck.  No significant common iliac artery aneurysms.  He is a candidate for endovascular repair.  He has had a preoperative Cardiolite May 22, 2001 with normal perfusion, ejection fraction 64%.  In the opinion of Dr. Olga Millers no further cardiac workup needed prior to this surgery.  The abdominal aortic aneurysm measures 5.3 cm in the AP dimension and the width is 5.6 cm.  ALLERGIES:  ACTIFED which gives him the jitters, and SULFA which gives him a rash.  MEDICATIONS: 1. Lotensin/HCT 20/12.5 one daily. 2. Timolol 0.5% ophthalmic solution one drop to the left eye daily. 3. Xalatan 0.005% ophthalmic solution one drop to both eyes daily.  PAST MEDICAL HISTORY: 1. Hypertension. 2. History of tobacco habituation - one and one-half packs per day for 30    years. 3. Significant ethanol use, one-fifth of distilled spirits weekly. 4. Glaucoma. 5. COPD/chronic  bronchitis.  PAST SURGICAL HISTORY:  Status post pilonidal cyst removal.  SOCIAL HISTORY:  The patient is married for the past 46 years.  He has three children, all of whom are in good health.  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  Once again, he has ongoing current tobacco usage, one and one-half packs per day for 30 years.  He has significant ethanol use, one-fifth of distilled spirits per week.  FAMILY HISTORY:  His mother died at age 15 after having a cerebrovascular accident.  His father died at age 85 - he also had a stroke.  This patient has four brothers and four sisters, all of whom are in good health.  REVIEW OF SYSTEMS:  He does admit to about a 10-pound weight loss this year. He does not have weakness, fever, or chills.  He has not had any overbearing fatigue, night sweats, dyspnea on exertion, lower extremity claudication.  He has no visual changes although he has a history of glaucoma.  No dizziness. He does have a persistent cough.  No chest pain.  He has COPD secondary to long-term continued tobacco habituation.  He does not have a history of ulcer disease nor paroxysmal nocturnal dyspnea.  He has no history of asthma, anemia, syncope, melena, hemoptysis, hematemesis, or easy bruising.  PHYSICAL EXAMINATION:  GENERAL:  This is a 77 year old male patient in no acute distress.  He is alert and oriented x 3.  His judgment and insight  are appropriate.  VITAL SIGNS:  Temperature is afebrile, pulse 80 and regular, respirations 20. Blood pressure on the left 150/84, blood pressure on the right 150/90.  HEENT:  Normocephalic.  Extraocular movements are intact.  Pupils are equal, round and reactive to light.  The oropharynx shows that he has an upper partial denture and a lower full denture.  The tongue is midline and without lesion or coating.  NECK:  Supple.  There is no jugular venous distention, no cervical lymphadenopathy, no thyromegaly.   There are no carotid bruits auscultated and the trachea is midline.  LUNGS:  Have inspiratory and expiratory wheezes with crackles throughout.  He does have a good respiratory incentive.  HEART:  Regular rate and rhythm without murmurs, rubs, or gallops.  ABDOMEN:  Soft, nondistended, nontender, bowel sounds are present.  The abdominal aorta is pulsatile and expansile.  He has no hepatosplenomegaly.  EXTREMITIES:  Show no evidence of cyanosis with mild clubbing.  He has no dependent edema.  The radial pulses are 4/4 bilaterally, femoral pulses are 4/4 bilaterally.  [name] complains of persistent soreness in the right groin status post arteriography which was done May 19, 2001.  He does have palpable pedal pulses bilaterally.  NEUROLOGIC:  Shows no focal deficits.  Gait is steady.  Deep tendon reflexes in the patellar region are 2+ bilaterally.  IMPRESSION: 1. Abdominal aortic aneurysm measuring 5.3 x 5.6 cm with a 2 cm infrarenal    neck, amenable to Aneurex stent grafting. 2. Hypertension. 3. Long-term continued tobacco habituation. 4. Significant ethanol usage. 5. Glaucoma. 6. Chronic obstructive pulmonary disease/chronic bronchitis secondary to    long-term tobacco use.  PLAN:  Plan is for stent graft to be placed July 01, 2001 by Dr. Waverly Ferrari. Dictated by:   Loura Pardon, P.A. Attending Physician:  Bennye Alm DD:  06/27/01 TD:  06/27/01 Job: [id number]

## 2010-10-27 NOTE — Op Note (Signed)
[place]. [facility]  Patient:    [name], [name] Visit Number: [id number] MRN: [id number]          Service Type: SUR Location: 3300 3314 01 Attending Physician:  Bennye Alm Dictated by:   Di Kindle. Edilia Bo, M.D. Proc. Date: 07/01/01 Admit Date:  07/01/2001   CC:         Gordy Savers, M.D. [facility]  Madolyn Frieze. Crenshaw, M.D. [facility]   Operative Report  PREOPERATIVE DIAGNOSIS:  A 5.5 cm abdominal aortic aneurysm.  POSTOPERATIVE DIAGNOSIS:  A 5.5 cm abdominal aortic aneurysm.  PROCEDURE:  Endovascular repair of 5.5 cm abdominal aortic aneurysm with an AneuRx bifurcated graft.  SURGEON:  Di Kindle. Edilia Bo, M.D.  ASSISTANT:  Larina Earthly, M.D.  ANESTHESIA:  General.  INDICATION:  This is a 77 year old gentleman who was found to have a pulsatile abdominal mass.  By CT this measured 5.5 cm.  He underwent preoperative cardiac evaluation by Dr. Jens Som and also underwent a preoperative arteriogram.  Based on his CT and arteriogram, he was felt to be a candidate for endovascular repair of his aneurysm.  The surgical option versus the endovascular option were discussed with the patient in detail, and he elected to proceed with endovascular repair of his aneurysm.  The procedure and potential complications were discussed with the patient in detail preoperatively.  DESCRIPTION OF PROCEDURE:  The patient was taken to the operating room and received a general anesthetic.  A marker was positioned adjacent to the vertebral column to allow assistance with placing the graft.  Based on his previous arteriogram, it was felt that his renal arteries would be approximately at the level of the top of the L2 vertebral body.  The abdomen and groins were prepped and draped in the usual sterile fashion.  Oblique incisions were made in both groins, and through these incisions the common femoral arteries were dissected free and controlled with  vessel loops.  Next, a guidewire was introduced into the right common femoral artery and advanced into the suprarenal aorta under fluoroscopic control.  An 8 French sheath was placed over a wire.  An 8 [place] was then introduced into the left femoral artery using the same Seldinger technique.  Next, a pigtail catheter was placed over the wire on the right side and the J-wire was exchanged for an Amplatz Super Stiff wire.  This was positioned up into the descending thoracic aorta and the position of the wire marked on the table so that it would not advance too far.  The pigtail catheter had been removed.  Next, a pigtail catheter was advanced over a wire from the left side and an aortogram as obtained.  The position of the renal arteries was identified.  Next, the bifurcated component of the graft was selected.  This was a 28 mm in diameter with a 16 mm iliac limb with the stent graft linked up to 16.5 cm.  This was model number [id number].  The orientation of the device was checked under fluoroscopy in this position so that we would position the contralateral limb posteriorly to allow for easier access through the left femoral approach.  The artery was clamped proximally and distally to the entrance of the wire, and this arteriotomy was extended.  The device was then passed over the stiff wire and fairly easily went into the common femoral artery, and it was advanced up above the level of the renal arteries.  It was partially deployed and then additional  injection was made to confirm position of the renal arteries.  The graft was then brought down just below the level of the renal arteries and then deployed further.  Additional spot film was obtained to be sure that we were in good position, and we were in perfect location just below the renal arteries.  Next, the bifurcated segment was fully deployed.  The bullet came through the graft without any hang-ups or problems and the runners came  out easily.  The device was then removed, and this was exchanged for an 8 [place] catheter, which was secured with a vessel loop.  Next, the pigtail catheter was brought down into the aneurysmal sac and then a guidewire was introduced into the contralateral limb without difficulty.  The pigtail catheter was then advanced into the graft and then twirled easily within the graft, confirming that it was not outside the graft.  The Super Stiff wire was then passed through the catheter.  A 16 French sheath was then passed over this wire and positioned up into the contralateral limb.  The iliac limb, which measured 16 mm in diameter with an 11.5 cm length, was then advanced up to the gate area from the bifurcated segment and positioned high in the gate.  The graft was then deployed and brought down into the common iliac artery.  Next, hand injections were made through the sheaths, which showed good seal distally. There was a slight blush up higher, possibly related to a lumbar.  We then brought 12 mm x 2 cm in length Powerflex balloons up both sides and with gentle distention ballooned the bifurcation area and also the gate area. Additional injections were made, and there was no evidence of endoleak with the exception of two small lumbars, which produced a very slight blush.  A pigtail catheter was positioned above and picture was obtained.  Again there was excellent attachment proximally and distally with no significant endoleaks noted.  The catheters then were removed, and the femoral arteries were closed with 5-0 Prolene sutures.  The incisions were irrigated with saline and hemostasis obtained.  The groins were closed with a deep layer of 2-0 Vicryl and the skin closed with 3-0 Vicryl.  Sterile dressings were applied, and the patient tolerated the procedure well, was transferred to the recovery room in satisfactory condition.  All needle and sponge counts were correct. Dictated by:    Di Kindle Edilia Bo, M.D.  Attending Physician:  Bennye Alm DD:  07/01/01 TD:  07/02/01 Job: [id number]

## 2010-10-27 NOTE — Letter (Signed)
April 15, 2006      Darryn Kydd  [address], [place]  [place], [place]  [address]   RE:  AUBERT, CHOYCE  MRN:  [id number]  /  DOB:  1933-11-04   Dear Milford Cage:   Mr. Nissim Fleischer is a patient followed in our Internal Medicine practice. He  has a number of medical problems including alcoholism.  He has been admitted  to the hospital on two previous occasions this year for alcohol withdrawal  and detoxification. More recently, [facility] has been [facility] for detoxification over seven days. On  the same day of his discharge, he began drinking heavily again.   This individual has chronic alcoholism and is in further need of further  detoxification and assistance in all aspects of daily living. If further  details are required, please do not hesitate to contact this office.    Sincerely,     ______________________________  Gordy Savers, MD    PFK/MedQ  DD: 04/15/2006  DT: 04/15/2006  Job #: [id number]

## 2010-10-27 NOTE — H&P (Signed)
[name], [name]                ACCOUNT NO.:  [id number]   MEDICAL RECORD NO.:  [id number]          PATIENT TYPE:  IPS   LOCATION:  0504                          FACILITY:  [facility]   PHYSICIAN:  Geoffery Lyons, M.D.      DATE OF BIRTH:  1933-08-18   DATE OF ADMISSION:  05/14/2006  DATE OF DISCHARGE:                       PSYCHIATRIC ADMISSION ASSESSMENT   IDENTIFICATION:  This is a 77 year old separated white male.  This is an  involuntary admission.   HISTORY OF PRESENT ILLNESS:  This patient was brought to the emergency  room after the family found him at home covered in feces and  intoxicated.  His alcohol level was 259 in the emergency room.  He has a  history of abusing large amounts of alcohol and has resumed daily  drinking.  He has also recently been adjudicated incompetent and his  daughter-in-law is his current guardian.  He has recently made  statements that he wanted to be left alone and drink until he dies.  He  reports his main stressor is the fact that his dog had died suddenly 3  days ago and he feels that possibly the neighbor is guilty of killing  the dog, although he is unable to state any specific criteria why he  would suspect this.  He was previously on Lexapro for depression and has  not been recently taking it, not taking his medications for hypertension  and other medical disorders and approximately 1 year ago his sister  died.  Also within the past 6 months his wife has left him because of  his refusal to stop drinking.  Today he endorses sadness and some  suicidal ideation but will not be specific. he feels he has no reason to  be here other than to be detoxed off the alcohol.  He understands that  his primary care physician as told him he should not be drinking and he  was able to maintain sobriety but says that he relapsed when he got  down in the dumps and felt he could not handle his depression any  longer.  Denies any hallucinations.   PAST PSYCHIATRIC  HISTORY:  The patient was last detoxed here in August  of 2007.  Resumed drinking shortly after that.  He has a history of  alcohol dependence and also has a history of threatening aggressive  behavior while he is intoxicated, although none prior to this admission.  He also has a history of suicide threats but no known attempts. He has  been treated before with Lexapro and no history of psychosis.  Does have  some history of delirium tremens.   SOCIAL HISTORY:  The patient is a retired [employer] architect, married once and  now has three grown children who live here in town.  He is currently  under the guardianship of his daughter-in-law having been adjudicated  incompetent recently.  Wife left him within the past year because of his  refusal to stop drinking.  No known current legal charges.  He lives  alone in his own home here in [place], [place], and is  insistent that he wants to return there to his own home.   FAMILY HISTORY:  Remarkable for a grandson with history of bipolar  disorder.   ALCOHOL AND DRUG HISTORY:  Noted above.  No other substance abuse known  other than alcohol.  No history of IV drug use.   MEDICAL HISTORY:  The patient is followed by Dr. Eleonore Chiquito and  Dr. Olga Millers.  Medical problems include  1. Hypertension.  2. COPD.  3. Glaucoma previously Timolol drops.  4. Dyslipidemia previously on Zocor.   PAST MEDICAL HISTORY:  1. Aortic aneurysm repair.  2. History of blackouts with drinking.  3. No known history of seizures.   MEDICATIONS:  1. Timolol drops dose unknown.  2. Lexapro 10 mg.  3. Benazepril 20/12.5 mg hydrochlorothiazide daily.  4. Zocor 40 mg daily.  5. Thiamine 100 mg daily.  6. Folic acid 1 mg daily.  Not sure when the patient has taken any of these medications the last  time.   DRUG ALLERGIES:  NO KNOWN DRUG ALLERGIES.   POSITIVE PHYSICAL FINDINGS:  The patient's full physical exam was done  in the emergency room.   It is noted in the record.  Today we note him to  be a thin and frail-appearing white male who appears to be his stated  age of 77.  He is in no acute distress at this time, calm, pleasant,  cooperative in the hospital gown. Height 5 feet 7-1/2 inches, 115  pounds, temperature 97.4, pulse 96, respirations 18, blood pressure  151/99.  As he was gauged at approximately eight this morning  neurological exam is nonfocal.   DIAGNOSTIC STUDIES:  CBC with wbc 4.1, hemoglobin 15.9, hematocrit 45.9,  platelets 196,000.  His MCV is at 99.8.  RDW 13.4.  Chemistry with  sodium 144, potassium 4.5, chloride 103, carbon dioxide 32, BUN 5,  creatinine 0.6.  Casual glucose is 103.  Liver enzymes with SGOT 83,  SGPT 67, total bilirubin 0.6, alkaline phosphatase is 104.  Urine drug  screen was negative for all substances.  Alcohol level in the ED was 259  mg/dL.  Urine was remarkable for been cloudy, specific gravity 1.014, he  had 30 mg/dL of protein in there, nitrites positive, leukocyte small,  wbc 3-6 and many bacteria.  TSH is currently pending.   MENTAL STATUS EXAM:  Fully alert male, pleasant, cooperative, blunted  affect, speech is normal in pace, tone and production.  He is fairly  articulate, forms his thoughts and expressions well.  He is talking  quite a bit about how he got depressed and how much he misses his dog.  This was obviously a significant companion for him.  He is very clear  that he wants to remain in his own home and maintain his independent  lifestyle.  He appreciates the fact that he has been detoxed before and  that his primary care practitioner has encouraged him to stay off the  alcohol which he tried to do.  He is candid about becoming depressed and  unable to control his drinking.  Thought process is logical and  coherent.  No evidence of psychosis.  Insight is poor but adequate for him to appreciate his situation.  Cognitively, he is intact to person,  place and  situation.   IMPRESSION:  AXIS I:  EtOH abuse and dependence, rule out depressive  disorder not otherwise specified.  AXIS II:  Deferred.  AXIS III:  1. Hypertension.  2. Glaucoma.  3. Hepatic transaminitis.  AXIS IV:  Severe issues with grief and impaired ability to live  independently, severe impairment with alcohol.  AXIS V:  Current 25, past year is 53.   PLAN:  Involuntarily admit the patient with every 15-minute checks in  place with a goal of a safe detox in 5 days and alleviate his suicidal  thoughts.  We are going to restart his routine medications except for  the Zocor which we will hold for now because of his elevated  transaminases.  We have placed a call to his primary care practitioner  to coordinate services and will plan on sending him follow up  information and have asked if we need to give the  patient any flu and pneumonia immunizations while he is here.  We are  waiting to hear back from them.  We are going to check an RPR and B12  level on him and will do a full mental status exam and plan for a family  session.  Patient is in agreement with the plan.      Margaret A. Scott, N.P.      Geoffery Lyons, M.D.  Electronically Signed    MAS/MEDQ  D:  05/15/2006  T:  05/15/2006  Job:  [id number]

## 2010-10-27 NOTE — Op Note (Signed)
[name], [name]                ACCOUNT NO.:  [id number]   MEDICAL RECORD NO.:  [id number]          PATIENT TYPE:  INP   LOCATION:  2307                         FACILITY:  [facility]   PHYSICIAN:  Di Kindle. Edilia Bo, M.D.DATE OF BIRTH:  01-01-1934   DATE OF PROCEDURE:  10/02/2005  DATE OF DISCHARGE:                                 OPERATIVE REPORT   PREOPERATIVE DIAGNOSIS:  Abdominal aortic aneurysm status post AneuRx repair  with migration of graft.   POSTOPERATIVE DIAGNOSIS:  Abdominal aortic aneurysm status post AneuRx  repair with migration of graft.   PROCEDURE:  Explantation of AneuRx stent graft and repair of abdominal  aortic aneurysm with aortobi-iliac graft with an 18 x 9 bifurcated  Hemashield graft.   SURGEON:  Di Kindle. Edilia Bo, M.D.   ASSISTANT:  Jerold Coombe, P.A.   ANESTHESIA:  General.   INDICATIONS:  This is a 77 year old gentleman who had undergone repair of an  abdominal aortic aneurysm in 2003.  In followup studies he had noted to  gradually have migration of the proximal aspect of his graft with some  kinking where the bifurcated component and an iliac limb overlapped.  Ultimately I was concerned that we would not have an adequate seal  proximally, and that he would develop a type 1 Endo leak with risk of  aneurysm expansion and rupture.  Given the angulation between the suprarenal  and infrarenal aorta, I did not think he was a candidate to have this  addressed with an endovascular approach; and open aneurysm repair and  explantation of the graft was recommended.   TECHNIQUE:  The patient was taken to the operating room and received a  general anesthetic.  Arterial line and Swan-Ganz catheter had been placed by  anesthesia.  The abdomen and groins were prepped and draped in the usual  sterile fashion. The abdomen was entered through a midline incision; and  upon exploration no other intra-abdominal pathology was noted.  The  transverse  colon was reflected superiorly, the small bowel was reflected to  the right. The duodenum was dissected off of the aneurysm; and the  dissection carried up to the renal vein.  The dissection was carried down  onto the right common iliac artery; and the right common iliac artery below  the landing point on the right limb of the graft of the old graft was  dissected out and controlled with a vessel loop.  I was also able to dissect  out the common iliac artery on the left below where the AneuRx graft ended.  Proximally there was a significant angulation of the neck and a very short  neck which extended right up to the right renal artery.  The right renal  artery was controlled with a vessel loop.  In order to provide adequate  exposure to clamp proximally, I divided the left renal vein between clamps  and each end was over sewn with 5-0 Prolene suture.  The patient was then  heparinized and given 20 grams of mannitol.  The clamp was then placed on  the common iliac arteries bilaterally.  The clamp proximally was placed  above the right renal and below the left renal artery.  The right renal  artery was controlled with a vessel loop.  The aneurysm was then entered and  the graft, itself, was stuck somewhat, especially distally, but with some  dissection I was able to get the AneuRx graft out in its entirety.  Lumbars  were over sewn with 2-0 silk ties. The proximal aorta below the right renal  was T-D off and a felt cuff was used. An 18 x 9 graft was cut to the  appropriate length and sewn end-to-end to the infrarenal aorta, sewing right  up to the level of the right renal artery.  The proximal anastomosis was  tested and was hemostatic; and the graft was flushed.  The clamp was then  placed on the graft below the anastomosis to allow perfusion of the right  renal artery.   Next, the right limb of the graft was cut to the appropriate length for  anastomosis to the right common iliac artery.   The artery did have some  atherosclerotic disease, but was patent.  The graft was sewn end-to-end to  the common iliac artery using continuous 4-0 Prolene suture.  Prior to  completing the anastomosis the artery was back bled and flushed  appropriately; and the anastomosis completed.  Flow was reestablished first  to the right hypogastric and then distally.   Next, attention was turned to the left iliac anastomosis.  The left limb of  the graft was cut to the appropriate length and sewn end-to-end to the left  common iliac artery. Again, prior to completing anastomosis the artery was  back bled and flushed appropriately; and the anastomosis completed.  Flow  was reestablished first to the left hypogastric and then distally.  At the  completion, hemostasis was obtained of the wounds.  Some plaque within the  aneurysm sac was debrided and additional lumbars were then over sewn with 2-  0 silk ties.  After hemostasis was obtained; and the heparin was reversed  with protamine.  The aneurysm sac was closed over the graft with running 2-0  Vicryl suture.  The retroperitoneal tissue was closed with running 2-0  Vicryl suture.  The abdominal contents were returned to their normal  position.  The colon was inspected and was well perfused.  The fascial layer  was closed with two #1 PDS sutures.  The subcutaneous layer was closed with  two running 3-0 Vicryl; and the skin was closed with staples.  Sterile  dressing was applied and the patient tolerated the procedure well; and was  transferred to the recovery room in satisfactory condition.  All needle and  sponge counts were correct.      Di Kindle. Edilia Bo, M.D.  Electronically Signed     CSD/MEDQ  D:  10/02/2005  T:  10/03/2005  Job:  [id number]   cc:   Gordy Savers, M.D. [facility]  [address] [place]  [place] [address]   Olga Millers, M.D. [facility]  [address]  [address]  [place] [address]

## 2012-01-09 ENCOUNTER — Encounter: Payer: Self-pay | Admitting: Neurosurgery

## 2012-01-15 ENCOUNTER — Encounter: Payer: Self-pay | Admitting: Neurosurgery

## 2012-01-16 ENCOUNTER — Encounter: Payer: Self-pay | Admitting: Neurosurgery

## 2012-01-16 ENCOUNTER — Ambulatory Visit (INDEPENDENT_AMBULATORY_CARE_PROVIDER_SITE_OTHER): Payer: Medicare Other | Admitting: Neurosurgery

## 2012-01-16 ENCOUNTER — Ambulatory Visit (INDEPENDENT_AMBULATORY_CARE_PROVIDER_SITE_OTHER): Payer: Medicare Other | Admitting: *Deleted

## 2012-01-16 VITALS — BP 110/64 | HR 86 | Resp 16 | Ht 67.0 in | Wt 139.3 lb

## 2012-01-16 DIAGNOSIS — I6529 Occlusion and stenosis of unspecified carotid artery: Secondary | ICD-10-CM

## 2012-01-16 NOTE — Progress Notes (Signed)
[facility] Carotid Office Note  CC: Two-year carotid followup Referring Physician: Edilia Bo  History of Present Illness: 78 year old outpatient Dr. Edilia Bo followed for mild bilateral carotid stenosis. The patient is an Alzheimer's patient in a skilled nursing facility and is seen today with his wife who answers for him. She reports no signs or symptoms of CVA, TIA, amaurosis fugax or any neural deficit.  Past Medical History  Diagnosis Date  . 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 COPD 11/05/2006  . DEMENTIA 12/19/2009  . DEPRESSION 11/05/2006  . DYSLIPIDEMIA 06/17/2008  . DYSURIA, HX OF 07/29/2008  . HERNIA 10/22/2007  . HX, PERSONAL, ALCOHOLISM 11/05/2006  . HYPERTENSION 11/05/2006  . LOW BACK PAIN 11/05/2006  . TOBACCO ABUSE 06/17/2008  . Bronchitis   . Alzheimer disease 2011    ROS: [x]  Positive   [ ]  Denies    General: [ ]  Weight loss, [ ]  Fever, [ ]  chills Neurologic: [ ]  Dizziness, [ ]  Blackouts, [ ]  Seizure [ ]  Stroke, [ ]  "Mini stroke", [ ]  Slurred speech, [ ]  Temporary blindness; [ ]  weakness in arms or legs, [ ]  Hoarseness Cardiac: [ ]  Chest pain/pressure, [ ]  Shortness of breath at rest [ ]  Shortness of breath with exertion, [ ]  Atrial fibrillation or irregular heartbeat Vascular: [ ]  Pain in legs with walking, [ ]  Pain in legs at rest, [ ]  Pain in legs at night,  [ ]  Non-healing ulcer, [ ]  Blood clot in vein/DVT,   Pulmonary: [ ]  Home oxygen, [ ]  Productive cough, [ ]  Coughing up blood, [ ]  Asthma,  [ ]  Wheezing Musculoskeletal:  [ ]  Arthritis, [ ]  Low back pain, [ ]  Joint pain Hematologic: [ ]  Easy Bruising, [ ]  Anemia; [ ]  Hepatitis Gastrointestinal: [ ]  Blood in stool, [ ]  Gastroesophageal Reflux/heartburn, [ ]  Trouble swallowing Urinary: [ ]  chronic Kidney disease, [ ]  on HD - [ ]  MWF or [ ]  TTHS, [ ]  Burning with urination, [ ]  Difficulty urinating Skin: [ ]  Rashes, [ ]  Wounds Psychological: [ ]  Anxiety, [ ]  Depression   Social  History History  Substance Use Topics  . Smoking status: Former Smoker    Types: Cigarettes    Quit date: 06/11/2008  . Smokeless tobacco: Not on file  . Alcohol Use: No    Family History Family History  Problem Relation Age of Onset  . Stroke Mother   . Stroke Father     Allergies  Allergen Reactions  . Sulfa Antibiotics Rash    Current Outpatient Prescriptions  Medication Sig Dispense Refill  . aspirin 81 MG tablet Take 81 mg by mouth daily.        . benazepril-hydrochlorthiazide (LOTENSIN HCT) 20-12.5 MG per tablet Take 1 tablet by mouth daily.        Marland Kitchen donepezil (ARICEPT) 10 MG tablet Take 10 mg by mouth at bedtime.        . fexofenadine (ALLEGRA) 180 MG tablet Take 180 mg by mouth daily as needed.        Marland Kitchen LORazepam (ATIVAN) 0.5 MG tablet Take 0.5 mg by mouth 2 (two) times daily as needed.        . memantine (NAMENDA) 10 MG tablet Take 10 mg by mouth daily.        . mirtazapine (REMERON) 15 MG tablet Take 15 mg by mouth at bedtime.        . pravastatin (PRAVACHOL) 20 MG tablet Take 20 mg by mouth  daily.        . prednisoLONE acetate (PRED FORTE) 1 % ophthalmic suspension Place 1 drop into both eyes at bedtime.        Marland Kitchen QUEtiapine (SEROQUEL) 25 MG tablet Take 25 mg by mouth at bedtime.        . travoprost, benzalkonium, (TRAVATAN) 0.004 % ophthalmic solution Place 1 drop into the left eye 4 (four) times daily.          Physical Examination  Filed Vitals:   01/16/12 1426  BP: 110/64  Pulse: 86  Resp:     Body mass index is 21.82 kg/(m^2).  General:  WDWN in NAD Gait: Normal HEENT: WNL Eyes: Pupils equal Pulmonary: normal non-labored breathing , without Rales, rhonchi,  wheezing Cardiac: RRR, without  Murmurs, rubs or gallops; Abdomen: soft, NT, no masses Skin: no rashes, ulcers noted  Vascular Exam Pulses: 3+ radial pulses bilaterally Carotid bruits: Carotid pulses to auscultation no bruits are heard Extremities without ischemic changes, no Gangrene , no  cellulitis; no open wounds;  Musculoskeletal: no muscle wasting or atrophy   Neurologic: A&O X 3; Appropriate Affect ; SENSATION: normal; MOTOR FUNCTION:  moving all extremities equally. Speech is fluent/normal  Non-Invasive Vascular Imaging CAROTID DUPLEX 01/16/2012  Right ICA 0 - 19% stenosis Left ICA 0 - 19% stenosis   ASSESSMENT/PLAN: Asymptomatic patient with mild bilateral carotid stenosis. The patient's wife requests followup every 2 years due to his condition so we will see him back in 2 years with repeat carotid duplex. Her questions were encouraged and answered.  Lauree Chandler ANP  Clinic MD: Edilia Bo

## 2012-01-16 NOTE — Addendum Note (Signed)
Addended by: Sharee Pimple on: 01/16/2012 03:14 PM   Modules accepted: Orders

## 2012-03-05 ENCOUNTER — Encounter: Payer: Self-pay | Admitting: Gastroenterology

## 2013-04-04 ENCOUNTER — Encounter (HOSPITAL_COMMUNITY): Payer: Self-pay | Admitting: Emergency Medicine

## 2013-04-04 ENCOUNTER — Emergency Department (HOSPITAL_COMMUNITY)
Admission: EM | Admit: 2013-04-04 | Discharge: 2013-04-04 | Disposition: A | Discharge status: Home / Self Care | Payer: Medicare Other | Attending: Emergency Medicine | Admitting: Emergency Medicine

## 2013-04-04 DIAGNOSIS — Z8719 Personal history of other diseases of the digestive system: Secondary | ICD-10-CM | POA: Insufficient documentation

## 2013-04-04 DIAGNOSIS — F1021 Alcohol dependence, in remission: Secondary | ICD-10-CM | POA: Insufficient documentation

## 2013-04-04 DIAGNOSIS — F0280 Dementia in other diseases classified elsewhere without behavioral disturbance: Secondary | ICD-10-CM | POA: Insufficient documentation

## 2013-04-04 DIAGNOSIS — F02818 Dementia in other diseases classified elsewhere, unspecified severity, with other behavioral disturbance: Secondary | ICD-10-CM | POA: Insufficient documentation

## 2013-04-04 DIAGNOSIS — F329 Major depressive disorder, single episode, unspecified: Secondary | ICD-10-CM | POA: Insufficient documentation

## 2013-04-04 DIAGNOSIS — R4689 Other symptoms and signs involving appearance and behavior: Secondary | ICD-10-CM

## 2013-04-04 DIAGNOSIS — Z79899 Other long term (current) drug therapy: Secondary | ICD-10-CM | POA: Insufficient documentation

## 2013-04-04 DIAGNOSIS — F911 Conduct disorder, childhood-onset type: Secondary | ICD-10-CM | POA: Insufficient documentation

## 2013-04-04 DIAGNOSIS — I10 Essential (primary) hypertension: Secondary | ICD-10-CM | POA: Insufficient documentation

## 2013-04-04 DIAGNOSIS — Z87891 Personal history of nicotine dependence: Secondary | ICD-10-CM | POA: Insufficient documentation

## 2013-04-04 DIAGNOSIS — E785 Hyperlipidemia, unspecified: Secondary | ICD-10-CM | POA: Insufficient documentation

## 2013-04-04 DIAGNOSIS — F3289 Other specified depressive episodes: Secondary | ICD-10-CM | POA: Insufficient documentation

## 2013-04-04 DIAGNOSIS — J4489 Other specified chronic obstructive pulmonary disease: Secondary | ICD-10-CM | POA: Insufficient documentation

## 2013-04-04 DIAGNOSIS — G309 Alzheimer's disease, unspecified: Secondary | ICD-10-CM | POA: Insufficient documentation

## 2013-04-04 DIAGNOSIS — F0281 Dementia in other diseases classified elsewhere with behavioral disturbance: Secondary | ICD-10-CM | POA: Insufficient documentation

## 2013-04-04 DIAGNOSIS — J449 Chronic obstructive pulmonary disease, unspecified: Secondary | ICD-10-CM | POA: Insufficient documentation
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
## 2013-04-04 NOTE — ED Notes (Signed)
Nursing Home called and requested a UA be done. Information relayed to PA. PA spoke with NH also and concluded a UA not needed at this time given the circumstances.

## 2013-04-04 NOTE — ED Notes (Signed)
Patient is alert and orient X3

## 2013-04-04 NOTE — ED Provider Notes (Deleted)
Medical screening examination/treatment/procedure(s) were performed by non-physician practitioner and as supervising physician I was immediately available for consultation/collaboration.  Maude Gloor L Kethan Papadopoulos, MD 04/04/13 1600 
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
## 2013-04-04 NOTE — ED Notes (Signed)
Bed: ZO10 Expected date:  Expected time:  Means of arrival:  Comments: ems-med clearance-dementia pt.
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
## 2013-07-30 ENCOUNTER — Other Ambulatory Visit (HOSPITAL_COMMUNITY): Payer: Self-pay | Admitting: Vascular Surgery

## 2013-07-30 ENCOUNTER — Other Ambulatory Visit: Payer: Self-pay | Admitting: Vascular Surgery

## 2013-07-30 DIAGNOSIS — I6529 Occlusion and stenosis of unspecified carotid artery: Secondary | ICD-10-CM

## 2013-12-02 ENCOUNTER — Encounter: Payer: Self-pay | Admitting: Gastroenterology

## 2014-01-07 ENCOUNTER — Telehealth: Payer: Self-pay | Admitting: Family

## 2014-01-07 NOTE — Telephone Encounter (Signed)
Message copied by Fredrich BirksMILLIKAN, DANA P on Thu Jan 07, 2014 11:05 AM ------      Message from: Marcellus ScottBRYANT, ALLISON M      Created: Thu Jan 07, 2014 11:00 AM      Regarding: CANCELLATION       Tyson DenseRalph Eastham has [facility] and will not be able to make his appointment for 03/03/14. He does not want to reschedule at this time.             Thanks,      Revonda StandardAllison ------

## 2014-01-25 ENCOUNTER — Other Ambulatory Visit (HOSPITAL_COMMUNITY): Payer: Medicare Other

## 2014-01-25 ENCOUNTER — Ambulatory Visit: Payer: Medicare Other | Admitting: Family

## 2014-01-27 ENCOUNTER — Other Ambulatory Visit (HOSPITAL_COMMUNITY): Payer: Medicare Other

## 2014-01-27 ENCOUNTER — Ambulatory Visit: Payer: Medicare Other | Admitting: Family

## 2014-03-03 ENCOUNTER — Other Ambulatory Visit (HOSPITAL_COMMUNITY): Payer: Medicare Other

## 2014-03-03 ENCOUNTER — Ambulatory Visit: Payer: Medicare Other | Admitting: Family

## 2014-06-22 ENCOUNTER — Encounter (HOSPITAL_COMMUNITY): Payer: Self-pay | Admitting: Emergency Medicine

## 2014-06-22 ENCOUNTER — Emergency Department (HOSPITAL_COMMUNITY): Payer: Medicare Other

## 2014-06-22 ENCOUNTER — Emergency Department (HOSPITAL_COMMUNITY)
Admission: EM | Admit: 2014-06-22 | Discharge: 2014-06-22 | Disposition: A | Discharge status: Home / Self Care | Payer: Medicare Other | Attending: Emergency Medicine | Admitting: Emergency Medicine

## 2014-06-22 DIAGNOSIS — Z87891 Personal history of nicotine dependence: Secondary | ICD-10-CM | POA: Insufficient documentation

## 2014-06-22 DIAGNOSIS — Z7982 Long term (current) use of aspirin: Secondary | ICD-10-CM | POA: Insufficient documentation

## 2014-06-22 DIAGNOSIS — F0390 Unspecified dementia without behavioral disturbance: Secondary | ICD-10-CM | POA: Insufficient documentation

## 2014-06-22 DIAGNOSIS — F329 Major depressive disorder, single episode, unspecified: Secondary | ICD-10-CM | POA: Diagnosis not present

## 2014-06-22 DIAGNOSIS — J449 Chronic obstructive pulmonary disease, unspecified: Secondary | ICD-10-CM | POA: Insufficient documentation

## 2014-06-22 DIAGNOSIS — I10 Essential (primary) hypertension: Secondary | ICD-10-CM | POA: Diagnosis not present

## 2014-06-22 DIAGNOSIS — R079 Chest pain, unspecified: Secondary | ICD-10-CM | POA: Insufficient documentation

## 2014-06-22 DIAGNOSIS — E785 Hyperlipidemia, unspecified: Secondary | ICD-10-CM | POA: Insufficient documentation

## 2014-06-22 DIAGNOSIS — Z8669 Personal history of other diseases of the nervous system and sense organs: Secondary | ICD-10-CM | POA: Insufficient documentation

## 2014-06-22 DIAGNOSIS — Z8719 Personal history of other diseases of the digestive system: Secondary | ICD-10-CM | POA: Diagnosis not present

## 2014-06-22 DIAGNOSIS — Z79899 Other long term (current) drug therapy: Secondary | ICD-10-CM | POA: Diagnosis not present

## 2014-06-22 LAB — CBC WITH DIFFERENTIAL/PLATELET
BASOS ABS: 0.1 10*3/uL (ref 0.0–0.1)
Basophils Relative: 2 % — ABNORMAL HIGH (ref 0–1)
EOS ABS: 0.3 10*3/uL (ref 0.0–0.7)
EOS PCT: 8 % — AB (ref 0–5)
HEMATOCRIT: 35 % — AB (ref 39.0–52.0)
Hemoglobin: 11.8 g/dL — ABNORMAL LOW (ref 13.0–17.0)
Lymphocytes Relative: 31 % (ref 12–46)
Lymphs Abs: 1.3 10*3/uL (ref 0.7–4.0)
MCH: 30.6 pg (ref 26.0–34.0)
MCHC: 33.7 g/dL (ref 30.0–36.0)
MCV: 90.7 fL (ref 78.0–100.0)
Monocytes Absolute: 0.4 10*3/uL (ref 0.1–1.0)
Monocytes Relative: 10 % (ref 3–12)
Neutro Abs: 2 10*3/uL (ref 1.7–7.7)
Neutrophils Relative %: 49 % (ref 43–77)
PLATELETS: 194 10*3/uL (ref 150–400)
RBC: 3.86 MIL/uL — ABNORMAL LOW (ref 4.22–5.81)
RDW: 13 % (ref 11.5–15.5)
WBC: 4.1 10*3/uL (ref 4.0–10.5)

## 2014-06-22 LAB — I-STAT TROPONIN, ED
TROPONIN I, POC: 0 ng/mL (ref 0.00–0.08)
Troponin i, poc: 0 ng/mL (ref 0.00–0.08)

## 2014-06-22 LAB — COMPREHENSIVE METABOLIC PANEL
ALT: 12 U/L (ref 0–53)
AST: 21 U/L (ref 0–37)
Albumin: 3.8 g/dL (ref 3.5–5.2)
Alkaline Phosphatase: 49 U/L (ref 39–117)
Anion gap: 7 (ref 5–15)
BUN: 48 mg/dL — ABNORMAL HIGH (ref 6–23)
CALCIUM: 9.3 mg/dL (ref 8.4–10.5)
CO2: 29 mmol/L (ref 19–32)
Chloride: 103 mEq/L (ref 96–112)
Creatinine, Ser: 1.31 mg/dL (ref 0.50–1.35)
GFR calc Af Amer: 58 mL/min — ABNORMAL LOW (ref >90)
GFR calc non Af Amer: 50 mL/min — ABNORMAL LOW (ref >90)
Glucose, Bld: 93 mg/dL (ref 70–99)
Potassium: 4.9 mmol/L (ref 3.5–5.1)
SODIUM: 139 mmol/L (ref 135–145)
TOTAL PROTEIN: 6.7 g/dL (ref 6.0–8.3)
Total Bilirubin: 0.4 mg/dL (ref 0.3–1.2)

## 2014-06-22 MED ORDER — IOHEXOL 350 MG/ML SOLN
80.0000 mL | Freq: Once | INTRAVENOUS | Status: AC | PRN
  Administered 2014-06-22: 80 mL via INTRAVENOUS

## 2014-06-22 NOTE — ED Notes (Signed)
Unable to redirect pt back to exam room. Pt ambulatory with second RN

## 2014-06-22 NOTE — ED Notes (Signed)
[facility] called to re-evaluate; one person ahead of patient for transfer

## 2014-06-22 NOTE — ED Notes (Signed)
Pt to ED via [facility] from [facility] [phone] [facility]) nursing facility for evaluation of sudden onset CP that radiated to left side of neck- pt given 324 ASA and 1 Nitro with EMS which relieved pain.  Pt denies pain upon arrival to ED.

## 2014-06-22 NOTE — ED Notes (Signed)
[facility] called for transport.  

## 2014-06-22 NOTE — Discharge Instructions (Signed)

## 2014-06-22 NOTE — ED Notes (Addendum)
Spoke to [name] at [facility]; reports patient stated "I feel like I'm going to die, my heart hurts" at 1600

## 2014-06-22 NOTE — ED Notes (Signed)
Spoke to ForsythJennifer, pts daughter concerning care. Attempted to call nursing facility multiple times without success.

## 2014-06-22 NOTE — ED Notes (Signed)
Pt ambulatory with steady gait to restroom. Pt discontinued cardiac monitoring wires. Pt would not sit in bed; pt assisted to chair

## 2014-06-22 NOTE — ED Notes (Signed)
Daughter and mother enroute to hospital

## 2014-06-22 NOTE — ED Notes (Signed)
Pt's wife reports pt was c/o arm pain. When asking patient about pain, pt sts "it's just fine and dandy." Skin abrasions noted to R arm with dried bleeding

## 2014-06-22 NOTE — ED Notes (Signed)
Family at bedside; updated family on plan of care. Pt sitting on side of bed

## 2014-06-22 NOTE — ED Provider Notes (Signed)
CSN: [id number]     Arrival date & time 06/22/14  1714 History   First MD Initiated Contact with Patient 06/22/14 1747     Chief Complaint  Patient presents with  . Chest Pain     (Consider location/radiation/quality/duration/timing/severity/associated sxs/prior Treatment) HPI  [age] year old male with a past history of Alzheimer's dementia presents after having chest pain at his living facility. According to EMS he had a sudden onset of chest pain and apparently radiated to the left side of his neck. Upon my evaluation patient he has no pain at all. He was given aspirin and one nitroglycerin by EMS. The patient currently denies all symptoms and denies that he ever had chest pain. He is currently looking for his clothes and seems distracted but denies any current distress or pain.  Past Medical History  Diagnosis Date  . 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 COPD 11/05/2006  . DEMENTIA 12/19/2009  . DEPRESSION 11/05/2006  . DYSLIPIDEMIA 06/17/2008  . DYSURIA, HX OF 07/29/2008  . HERNIA 10/22/2007  . HX, PERSONAL, ALCOHOLISM 11/05/2006  . HYPERTENSION 11/05/2006  . LOW BACK PAIN 11/05/2006  . TOBACCO ABUSE 06/17/2008  . Bronchitis   . Alzheimer disease 2011   Past Surgical History  Procedure Laterality Date  . Aortobifem  2007  . Glaucoma repair      Left eye  . Pilonidal cyst excision    . Abdominal aortic aneurysm repair  Jan. 2003    Stent Graft   Family History  Problem Relation Age of Onset  . Stroke Mother   . Stroke Father    History  Substance Use Topics  . Smoking status: Former Smoker    Types: Cigarettes    Quit date: 06/11/2008  . Smokeless tobacco: Not on file  . Alcohol Use: No    Review of Systems  Unable to perform ROS: Dementia      Allergies  Sulfa antibiotics  Home Medications   Prior to Admission medications   Medication Sig Start Date End Date Taking? Authorizing Provider  aspirin 81 MG chewable tablet Chew 81 mg by mouth every morning.    Yes Historical Provider, MD  benazepril-hydrochlorthiazide (LOTENSIN HCT) 20-12.5 MG per tablet Take 1 tablet by mouth every morning.    Yes Historical Provider, MD  divalproex (DEPAKOTE ER) 500 MG 24 hr tablet Take by mouth daily.   Yes Historical Provider, MD  latanoprost (XALATAN) 0.005 % ophthalmic solution Place 1 drop into both eyes at bedtime.   Yes Historical Provider, MD  loratadine (CLARITIN) 10 MG tablet Take 10 mg by mouth every morning.   Yes Historical Provider, MD  mirtazapine (REMERON) 30 MG tablet Take 30 mg by mouth at bedtime.   Yes Historical Provider, MD  Multiple Vitamin (MULTIVITAMIN WITH MINERALS) TABS tablet Take 1 tablet by mouth every morning.   Yes Historical Provider, MD  polyvinyl alcohol (LIQUIFILM TEARS) 1.4 % ophthalmic solution Place 1 drop into both eyes 2 (two) times daily.   Yes Historical Provider, MD  pravastatin (PRAVACHOL) 10 MG tablet Take 10 mg by mouth at bedtime.    Yes Historical Provider, MD  protein supplement (RESOURCE BENEPROTEIN) POWD Take 1 scoop by mouth 3 (three) times daily with meals.   Yes Historical Provider, MD  QUEtiapine (SEROQUEL) 25 MG tablet Take 25 mg by mouth 2 (two) times daily.    Yes Historical Provider, MD   BP 97/53 mmHg  Pulse 89  Temp(Src) 98.5 F (36.9 C) (Oral)  Resp 16  Ht   (1.702 m)  Wt 135 lb (61.236 kg)  BMI 21.14 kg/m2  SpO2 99% Physical Exam  Constitutional: He appears well-developed and well-nourished.  HENT:  Head: Normocephalic and atraumatic.  Right Ear: External ear normal.  Left Ear: External ear normal.  Nose: Nose normal.  Eyes: Right eye exhibits no discharge. Left eye exhibits no discharge.  Neck: Neck supple.  Cardiovascular: Normal rate, regular rhythm, normal heart sounds and intact distal pulses.   Pulmonary/Chest: Effort normal and breath sounds normal. He exhibits no tenderness.  Abdominal: Soft. There is no tenderness.  Musculoskeletal: He exhibits no edema.  Neurological: He is  alert. He is disoriented. Gait normal.  Skin: Skin is warm and dry.  Nursing note and vitals reviewed.   ED Course  Procedures (including critical care time) Labs Review Labs Reviewed  CBC WITH DIFFERENTIAL - Abnormal; Notable for the following:    RBC 3.86 (*)    Hemoglobin 11.8 (*)    HCT 35.0 (*)    Eosinophils Relative 8 (*)    Basophils Relative 2 (*)    All other components within normal limits  COMPREHENSIVE METABOLIC PANEL - Abnormal; Notable for the following:    BUN 48 (*)    GFR calc non Af Amer 50 (*)    GFR calc Af Amer 58 (*)    All other components within normal limits  I-STAT TROPOININ, ED  Rosezena Sensor, ED    Imaging Review Dg Chest 2 View  06/22/2014   CLINICAL DATA:  Sudden onset chased pain extending down the left arm. Dementia.  EXAM: CHEST  2 VIEW  COMPARISON:  03/02/2010  FINDINGS: Tortuous and atherosclerotic thoracic aorta. Double contour of the aortic arch -aortic arch aneurysm not readily excluded although some of this may be from leftward rotation. Chronic density in the lingula not appreciably changed from 2011 and accordingly likely from scarring/benign causes.  Mild thoracic spondylosis. Mild anterior wedging of L1, chronic and stable. No cardiomegaly or acute thoracic findings.  IMPRESSION: 1. 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 2. Chronic scarring in the lingula. 3. Heart size within normal limits.  No edema.   Electronically Signed   By: Herbie Baltimore M.D.   On: 06/22/2014 18:50   Ct Angio Chest Aorta W/cm &/or Wo/cm  06/22/2014   CLINICAL DATA:  Left-sided chest pain radiating into the neck.  EXAM: CT ANGIOGRAPHY CHEST WITH CONTRAST  TECHNIQUE: Multidetector CT imaging of the chest was performed using the standard protocol during bolus administration of intravenous contrast. Multiplanar CT image reconstructions and MIPs were obtained to evaluate the vascular anatomy.  CONTRAST:  80mL OMNIPAQUE  IOHEXOL 350 MG/ML SOLN  COMPARISON:  Chest radiographs earlier the same day.  FINDINGS: The thoracic aorta is normal in caliber, tortuous in its course. There is no thoracic aortic aneurysm. There is moderate calcified and noncalcified atheromatous plaque throughout the entire length. There is no evidence of aortic hematoma or acute aortic abnormality. There is a conventional branching pattern from the aortic arch. The heart is normal in size. Mitral annulus calcifications are seen. There are no filling defects within the central pulmonary arteries to suggest pulmonary embolus.  Moderate is emphysematous changes throughout the lungs. Scarring/atelectasis in the lingula and right middle lobes. There is no consolidation, pulmonary nodule or mass.  There is no mediastinal or hilar adenopathy. There is no pleural or pericardial effusion. Tiny subcentimeter nodules in both lobes of thyroid gland.  Evaluation of the upper abdomen demonstrates normal caliber upper abdominal aorta with flaring in the infrarenal portion, patient is post abdominal aortic aneurysm repair, uppermost portion included in the field of view. No acute upper abdominal abnormality. No acute osseous abnormalities. There is degenerative change throughout the spine. No focal osseous lesion.  Review of the MIP images confirms the above findings.  IMPRESSION: 1. Atheromatous plaque throughout the thoracic aorta without aneurysm or dissection. 2. Moderate emphysematous change. No acute intrathoracic process. Scarring/atelectasis in the lingula.   Electronically Signed   By: Rubye Oaks M.D.   On: 06/22/2014 20:20     EKG Interpretation   Date/Time:  Tuesday June 22 2014 17:24:04 EST Ventricular Rate:  90 PR Interval:  168 QRS Duration: 85 QT Interval:  358 QTC Calculation: 438 R Axis:   85 Text Interpretation:  Sinus rhythm Borderline right axis deviation  Baseline wander in lead(s) III V3 V4 no acute ST/T changes no significant   change since 2011 Confirmed by Loveta Dellis  MD, Caterina Racine ([id number]) on 06/22/2014  5:55:34 PM      MDM   Final diagnoses:  Chest pain    Patient cannot provide history but had chest pain according to NH. Given this, ACS workup started, is unremarkable including no acute EKG changes or troponin elevation. Given abnormality on CXR, CTAngio obtained, is negative for dissection. After discussion with daughter and wife they prefer to do delta troponin and follow up with PCP as they would not want aggressive care, stress tests or hospitalization.     Audree Camel, MD 06/22/14 2126

## 2014-06-22 NOTE — ED Notes (Signed)
[facility] c/o left sided chest pain with radiation to neck. Pt denies chest pain, NV, or shortness of breath on arrival.

## 2014-06-22 NOTE — ED Notes (Signed)
Pt given a coke and assisted to a chair

## 2014-07-19 ENCOUNTER — Encounter: Payer: Self-pay | Admitting: Family

## 2014-07-21 ENCOUNTER — Ambulatory Visit: Payer: Self-pay | Admitting: Family

## 2014-07-21 ENCOUNTER — Other Ambulatory Visit (HOSPITAL_COMMUNITY): Payer: Medicare Other

## 2015-04-15 ENCOUNTER — Emergency Department (HOSPITAL_COMMUNITY): Payer: Medicare Other

## 2015-04-15 ENCOUNTER — Inpatient Hospital Stay (HOSPITAL_COMMUNITY)
Admission: EM | Admit: 2015-04-15 | Discharge: 2015-04-20 | DRG: 391 | Disposition: A | Discharge status: Skilled Nursing Facility | Payer: Medicare Other | Attending: Internal Medicine | Admitting: Internal Medicine

## 2015-04-15 ENCOUNTER — Encounter (HOSPITAL_COMMUNITY): Payer: Self-pay

## 2015-04-15 DIAGNOSIS — M419 Scoliosis, unspecified: Secondary | ICD-10-CM | POA: Diagnosis present

## 2015-04-15 DIAGNOSIS — Z7982 Long term (current) use of aspirin: Secondary | ICD-10-CM | POA: Diagnosis not present

## 2015-04-15 DIAGNOSIS — R197 Diarrhea, unspecified: Secondary | ICD-10-CM | POA: Diagnosis present

## 2015-04-15 DIAGNOSIS — K6289 Other specified diseases of anus and rectum: Secondary | ICD-10-CM | POA: Diagnosis present

## 2015-04-15 DIAGNOSIS — R451 Restlessness and agitation: Secondary | ICD-10-CM | POA: Diagnosis present

## 2015-04-15 DIAGNOSIS — N179 Acute kidney failure, unspecified: Secondary | ICD-10-CM | POA: Diagnosis present

## 2015-04-15 DIAGNOSIS — K921 Melena: Secondary | ICD-10-CM

## 2015-04-15 DIAGNOSIS — E785 Hyperlipidemia, unspecified: Secondary | ICD-10-CM | POA: Diagnosis present

## 2015-04-15 DIAGNOSIS — I10 Essential (primary) hypertension: Secondary | ICD-10-CM | POA: Diagnosis present

## 2015-04-15 DIAGNOSIS — K4020 Bilateral inguinal hernia, without obstruction or gangrene, not specified as recurrent: Secondary | ICD-10-CM | POA: Diagnosis present

## 2015-04-15 DIAGNOSIS — E872 Acidosis, unspecified: Secondary | ICD-10-CM

## 2015-04-15 DIAGNOSIS — G9340 Encephalopathy, unspecified: Secondary | ICD-10-CM | POA: Diagnosis present

## 2015-04-15 DIAGNOSIS — Z823 Family history of stroke: Secondary | ICD-10-CM | POA: Diagnosis not present

## 2015-04-15 DIAGNOSIS — F329 Major depressive disorder, single episode, unspecified: Secondary | ICD-10-CM | POA: Diagnosis present

## 2015-04-15 DIAGNOSIS — K529 Noninfective gastroenteritis and colitis, unspecified: Secondary | ICD-10-CM

## 2015-04-15 DIAGNOSIS — A09 Infectious gastroenteritis and colitis, unspecified: Principal | ICD-10-CM | POA: Diagnosis present

## 2015-04-15 DIAGNOSIS — Z87891 Personal history of nicotine dependence: Secondary | ICD-10-CM

## 2015-04-15 LAB — CBC WITH DIFFERENTIAL/PLATELET
BASOS ABS: 0 10*3/uL (ref 0.0–0.1)
Basophils Relative: 0 %
EOS PCT: 0 %
Eosinophils Absolute: 0 10*3/uL (ref 0.0–0.7)
HCT: 33.5 % — ABNORMAL LOW (ref 39.0–52.0)
Hemoglobin: 11.2 g/dL — ABNORMAL LOW (ref 13.0–17.0)
LYMPHS ABS: 0.5 10*3/uL — AB (ref 0.7–4.0)
LYMPHS PCT: 6 %
MCH: 31.5 pg (ref 26.0–34.0)
MCHC: 33.4 g/dL (ref 30.0–36.0)
MCV: 94.4 fL (ref 78.0–100.0)
MONO ABS: 0.7 10*3/uL (ref 0.1–1.0)
Monocytes Relative: 8 %
Neutro Abs: 7.3 10*3/uL (ref 1.7–7.7)
Neutrophils Relative %: 86 %
PLATELETS: 199 10*3/uL (ref 150–400)
RBC: 3.55 MIL/uL — ABNORMAL LOW (ref 4.22–5.81)
RDW: 13.4 % (ref 11.5–15.5)
WBC: 8.5 10*3/uL (ref 4.0–10.5)

## 2015-04-15 LAB — COMPREHENSIVE METABOLIC PANEL
ALK PHOS: 51 U/L (ref 38–126)
ALT: 13 U/L — ABNORMAL LOW (ref 17–63)
ANION GAP: 7 (ref 5–15)
AST: 22 U/L (ref 15–41)
Albumin: 3.8 g/dL (ref 3.5–5.0)
BILIRUBIN TOTAL: 0.7 mg/dL (ref 0.3–1.2)
BUN: 45 mg/dL — ABNORMAL HIGH (ref 6–20)
CO2: 29 mmol/L (ref 22–32)
Calcium: 9 mg/dL (ref 8.9–10.3)
Chloride: 102 mmol/L (ref 101–111)
Creatinine, Ser: 2.02 mg/dL — ABNORMAL HIGH (ref 0.61–1.24)
GFR calc Af Amer: 34 mL/min — ABNORMAL LOW (ref >60)
GFR calc non Af Amer: 30 mL/min — ABNORMAL LOW (ref >60)
Glucose, Bld: 133 mg/dL — ABNORMAL HIGH (ref 65–99)
POTASSIUM: 4.3 mmol/L (ref 3.5–5.1)
Sodium: 138 mmol/L (ref 135–145)
TOTAL PROTEIN: 6.8 g/dL (ref 6.5–8.1)

## 2015-04-15 LAB — I-STAT CHEM 8, ED
BUN: 57 mg/dL — ABNORMAL HIGH (ref 6–20)
Calcium, Ion: 1.19 mmol/L (ref 1.13–1.30)
Chloride: 100 mmol/L — ABNORMAL LOW (ref 101–111)
Creatinine, Ser: 1.8 mg/dL — ABNORMAL HIGH (ref 0.61–1.24)
Glucose, Bld: 135 mg/dL — ABNORMAL HIGH (ref 65–99)
HEMATOCRIT: 32 % — AB (ref 39.0–52.0)
Hemoglobin: 10.9 g/dL — ABNORMAL LOW (ref 13.0–17.0)
POTASSIUM: 4.8 mmol/L (ref 3.5–5.1)
Sodium: 140 mmol/L (ref 135–145)
TCO2: 29 mmol/L (ref 0–100)

## 2015-04-15 LAB — I-STAT CG4 LACTIC ACID, ED: LACTIC ACID, VENOUS: 2.22 mmol/L — AB (ref 0.5–2.0)

## 2015-04-15 MED ORDER — ACETAMINOPHEN 500 MG PO TABS
500.0000 mg | ORAL_TABLET | Freq: Two times a day (BID) | ORAL | Status: DC | PRN
  Administered 2015-04-16: 500 mg via ORAL
  Filled 2015-04-15: qty 1

## 2015-04-15 MED ORDER — METRONIDAZOLE IN NACL 5-0.79 MG/ML-% IV SOLN
500.0000 mg | Freq: Once | INTRAVENOUS | Status: AC
  Administered 2015-04-16: 500 mg via INTRAVENOUS
  Filled 2015-04-15: qty 100

## 2015-04-15 MED ORDER — CIPROFLOXACIN IN D5W 400 MG/200ML IV SOLN
400.0000 mg | Freq: Once | INTRAVENOUS | Status: AC
  Administered 2015-04-16: 400 mg via INTRAVENOUS
  Filled 2015-04-15: qty 200

## 2015-04-15 MED ORDER — LATANOPROST 0.005 % OP SOLN
1.0000 [drp] | Freq: Every day | OPHTHALMIC | Status: DC
Start: 2015-04-16 — End: 2015-04-20
  Administered 2015-04-16 – 2015-04-19 (×5): 1 [drp] via OPHTHALMIC
  Filled 2015-04-15: qty 2.5

## 2015-04-15 MED ORDER — QUETIAPINE FUMARATE 50 MG PO TABS
50.0000 mg | ORAL_TABLET | Freq: Every day | ORAL | Status: DC
  Administered 2015-04-16 – 2015-04-20 (×5): 50 mg via ORAL
  Filled 2015-04-15 (×5): qty 1

## 2015-04-15 MED ORDER — SODIUM CHLORIDE 0.9 % IV BOLUS (SEPSIS)
1000.0000 mL | Freq: Once | INTRAVENOUS | Status: AC
  Administered 2015-04-15: 1000 mL via INTRAVENOUS

## 2015-04-15 MED ORDER — PRAVASTATIN SODIUM 10 MG PO TABS
10.0000 mg | ORAL_TABLET | Freq: Every day | ORAL | Status: DC
  Administered 2015-04-16 – 2015-04-19 (×5): 10 mg via ORAL
  Filled 2015-04-15 (×6): qty 1

## 2015-04-15 MED ORDER — QUETIAPINE FUMARATE 100 MG PO TABS
100.0000 mg | ORAL_TABLET | Freq: Every day | ORAL | Status: DC
  Administered 2015-04-16 – 2015-04-19 (×5): 100 mg via ORAL
  Filled 2015-04-15 (×6): qty 1

## 2015-04-15 MED ORDER — SODIUM CHLORIDE 0.9 % IV SOLN
INTRAVENOUS | Status: DC
  Administered 2015-04-16 – 2015-04-18 (×5): via INTRAVENOUS

## 2015-04-15 MED ORDER — LORATADINE 10 MG PO TABS
10.0000 mg | ORAL_TABLET | Freq: Every morning | ORAL | Status: DC
  Administered 2015-04-16 – 2015-04-20 (×5): 10 mg via ORAL
  Filled 2015-04-15 (×5): qty 1

## 2015-04-15 MED ORDER — DIVALPROEX SODIUM ER 500 MG PO TB24
500.0000 mg | ORAL_TABLET | Freq: Every day | ORAL | Status: DC
  Administered 2015-04-16 – 2015-04-20 (×4): 500 mg via ORAL
  Filled 2015-04-15 (×5): qty 1

## 2015-04-15 MED ORDER — LORAZEPAM 0.5 MG PO TABS
0.5000 mg | ORAL_TABLET | Freq: Two times a day (BID) | ORAL | Status: DC
  Administered 2015-04-16 – 2015-04-17 (×3): 0.5 mg via ORAL
  Filled 2015-04-15 (×3): qty 1

## 2015-04-15 MED ORDER — MIRTAZAPINE 30 MG PO TABS
30.0000 mg | ORAL_TABLET | Freq: Every day | ORAL | Status: DC
  Administered 2015-04-16 – 2015-04-19 (×5): 30 mg via ORAL
  Filled 2015-04-15 (×6): qty 1

## 2015-04-15 MED ORDER — ENSURE ENLIVE PO LIQD
237.0000 mL | Freq: Three times a day (TID) | ORAL | Status: DC
  Administered 2015-04-16 – 2015-04-20 (×10): 237 mL via ORAL

## 2015-04-15 MED ORDER — ADULT MULTIVITAMIN W/MINERALS CH
1.0000 | ORAL_TABLET | Freq: Every morning | ORAL | Status: DC
  Administered 2015-04-16 – 2015-04-20 (×4): 1 via ORAL
  Filled 2015-04-15 (×5): qty 1

## 2015-04-15 MED ORDER — QUETIAPINE FUMARATE 50 MG PO TABS: 50.0000 mg | ORAL_TABLET | Freq: Two times a day (BID) | ORAL | Status: DC

## 2015-04-15 MED ORDER — METRONIDAZOLE IN NACL 5-0.79 MG/ML-% IV SOLN
500.0000 mg | Freq: Three times a day (TID) | INTRAVENOUS | Status: DC
  Administered 2015-04-16 – 2015-04-18 (×7): 500 mg via INTRAVENOUS
  Filled 2015-04-15 (×8): qty 100

## 2015-04-15 MED ORDER — GLYCERIN-HYPROMELLOSE-PEG 400 0.2-0.2-1 % OP SOLN: 1.0000 [drp] | Freq: Two times a day (BID) | OPHTHALMIC | Status: DC

## 2015-04-15 MED ORDER — POLYVINYL ALCOHOL 1.4 % OP SOLN
1.0000 [drp] | Freq: Two times a day (BID) | OPHTHALMIC | Status: DC
  Administered 2015-04-16 – 2015-04-20 (×9): 1 [drp] via OPHTHALMIC
  Filled 2015-04-15: qty 15

## 2015-04-15 NOTE — ED Notes (Signed)
Bed: [id number] Expected date:  Expected time:  Means of arrival:  Comments: EMS 72's rectal bleeding hypotensive

## 2015-04-15 NOTE — ED Notes (Signed)
Patient transported to CT 

## 2015-04-15 NOTE — ED Notes (Signed)
Notified edp results from Istat lactic acid and chem 8

## 2015-04-15 NOTE — ED Notes (Signed)
Nurse drawing labs. 

## 2015-04-16 ENCOUNTER — Encounter (HOSPITAL_COMMUNITY): Payer: Self-pay | Admitting: General Surgery

## 2015-04-16 DIAGNOSIS — A09 Infectious gastroenteritis and colitis, unspecified: Principal | ICD-10-CM

## 2015-04-16 LAB — URINALYSIS, ROUTINE W REFLEX MICROSCOPIC
Glucose, UA: NEGATIVE mg/dL
Hgb urine dipstick: NEGATIVE
Ketones, ur: NEGATIVE mg/dL
LEUKOCYTES UA: NEGATIVE
Nitrite: NEGATIVE
PH: 5 (ref 5.0–8.0)
Protein, ur: NEGATIVE mg/dL
Specific Gravity, Urine: 1.013 (ref 1.005–1.030)
Urobilinogen, UA: 1 mg/dL (ref 0.0–1.0)

## 2015-04-16 LAB — CBC
HEMATOCRIT: 31.7 % — AB (ref 39.0–52.0)
HEMOGLOBIN: 10.4 g/dL — AB (ref 13.0–17.0)
MCH: 30.4 pg (ref 26.0–34.0)
MCHC: 32.8 g/dL (ref 30.0–36.0)
MCV: 92.7 fL (ref 78.0–100.0)
Platelets: 173 10*3/uL (ref 150–400)
RBC: 3.42 MIL/uL — AB (ref 4.22–5.81)
RDW: 13.2 % (ref 11.5–15.5)
WBC: 6.4 10*3/uL (ref 4.0–10.5)

## 2015-04-16 LAB — BASIC METABOLIC PANEL
ANION GAP: 7 (ref 5–15)
BUN: 32 mg/dL — ABNORMAL HIGH (ref 6–20)
CHLORIDE: 108 mmol/L (ref 101–111)
CO2: 23 mmol/L (ref 22–32)
Calcium: 8.5 mg/dL — ABNORMAL LOW (ref 8.9–10.3)
Creatinine, Ser: 1.37 mg/dL — ABNORMAL HIGH (ref 0.61–1.24)
GFR calc non Af Amer: 47 mL/min — ABNORMAL LOW (ref >60)
GFR, EST AFRICAN AMERICAN: 55 mL/min — AB (ref >60)
Glucose, Bld: 109 mg/dL — ABNORMAL HIGH (ref 65–99)
Potassium: 3.9 mmol/L (ref 3.5–5.1)
Sodium: 138 mmol/L (ref 135–145)

## 2015-04-16 MED ORDER — CIPROFLOXACIN IN D5W 400 MG/200ML IV SOLN
400.0000 mg | Freq: Two times a day (BID) | INTRAVENOUS | Status: DC
  Administered 2015-04-16 – 2015-04-18 (×5): 400 mg via INTRAVENOUS
  Filled 2015-04-16 (×5): qty 200

## 2015-04-16 NOTE — Progress Notes (Addendum)
ANTIBIOTIC CONSULT NOTE - INITIAL  Pharmacy Consult for Cipro Indication: Intra-abdominal infection  Allergies  Allergen Reactions  . Sulfa Antibiotics Rash    Patient Measurements:    Vital Signs: Temp: 97.8 F (36.6 C) (11/04 2109) Temp Source: Rectal (11/04 2109) BP: 133/89 mmHg (11/05 0000) Pulse Rate: 87 (11/04 2053) Intake/Output from previous day:   Intake/Output from this shift:    Labs:  Recent Labs  04/15/15 2101 04/15/15 2203  WBC 8.5  --   HGB 11.2* 10.9*  PLT 199  --   CREATININE 2.02* 1.80*   CrCl cannot be calculated (Unknown ideal weight.). No results for input(s): VANCOTROUGH, VANCOPEAK, VANCORANDOM, GENTTROUGH, GENTPEAK, GENTRANDOM, TOBRATROUGH, TOBRAPEAK, TOBRARND, AMIKACINPEAK, AMIKACINTROU, AMIKACIN in the last 72 hours.   Microbiology: No results found for this or any previous visit (from the past 720 hour(s)).  Medical History: Past Medical History  Diagnosis Date  . 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 COPD 11/05/2006  . DEMENTIA 12/19/2009  . DEPRESSION 11/05/2006  . DYSLIPIDEMIA 06/17/2008  . DYSURIA, HX OF 07/29/2008  . HERNIA 10/22/2007  . HX, PERSONAL, ALCOHOLISM 11/05/2006  . HYPERTENSION 11/05/2006  . LOW BACK PAIN 11/05/2006  . TOBACCO ABUSE 06/17/2008  . Bronchitis   . Alzheimer disease 2011    Medications:  Scheduled:  . ciprofloxacin  400 mg Intravenous Once  . divalproex  500 mg Oral Daily  . feeding supplement (ENSURE ENLIVE)  237 mL Oral TID BM  . latanoprost  1 drop Both Eyes QHS  . loratadine  10 mg Oral q morning - 10a  . LORazepam  0.5 mg Oral BID  . metronidazole  500 mg Intravenous Once  . metronidazole  500 mg Intravenous Q8H  . mirtazapine  30 mg Oral QHS  . multivitamin with minerals  1 tablet Oral q morning - 10a  . polyvinyl alcohol  1 drop Both Eyes BID  . pravastatin  10 mg Oral QHS  . QUEtiapine  100 mg Oral QHS  . QUEtiapine  50 mg Oral Daily   Infusions:  . sodium chloride      Assessment:  [age] yr male with blood diarrhea  Patient admitted for colitis (suspected infectious)   Cipro 400mg  IV x 1 ordered in ED and Flagyl 500mg  IV q8h ordered  Pharmacy consulted to dose Cipro for intra-abdominal infeciton  Currently no current height/weight available   Scr = 1.8  11/5 >>cipro >> 11/5 >>flagyl >>    11/5 blood: 11/5 urine: 11/5 C Diff:   Goal of Therapy:  Eradication of infection  Plan:  Follow up culture results   Requested updated height and weight in EPIC  Will order continued Cipro regimen once renal function is determined upon documentation of height/weight  Afton Lavalle, Joselyn GlassmanLeann Trefz, PharmD 04/16/2015,12:52 AM  ADDENDUM Spoke with RN who informs that patient's bed does not have a scale and pt unable to provide weight.  RN estimates weight around 155 pounds (70kg).    Est CrCl ~ 33 ml/min  Plan:  Continue Cipro 400mg  IV q12h           F/U actual weight once it is obtained and confirm appropriate dosing  Terrilee FilesLeann Isys Tietje, PharmD 04/16/15 @ 04:51

## 2015-04-16 NOTE — H&P (Signed)
[facility] History and Physical  [name] JXB:[id number] DOB: 1934-06-09 DOA: 04/15/2015  Referring physician: EDP PCP: Florentina JennyRIPP, HENRY, [name]   Chief Complaint: Diarrhea   HPI: [name] is a 81 y.o. [name] who is brought in to the ED with bloody diarrhea  Review of Systems: unable to perform due to dementia.  Past Medical History  Diagnosis Date  . 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 COPD 11/05/2006  . DEMENTIA 12/19/2009  . DEPRESSION 11/05/2006  . DYSLIPIDEMIA 06/17/2008  . DYSURIA, HX OF 07/29/2008  . HERNIA 10/22/2007  . HX, PERSONAL, ALCOHOLISM 11/05/2006  . HYPERTENSION 11/05/2006  . LOW BACK PAIN 11/05/2006  . TOBACCO ABUSE 06/17/2008  . Bronchitis   . Alzheimer disease 2011   Past Surgical History  Procedure Laterality Date  . Aortobifem  2007  . Glaucoma repair      Left eye  . Pilonidal cyst excision    . Abdominal aortic aneurysm repair  Jan. 2003    Stent Graft   Social History:  reports that he quit smoking about 6 years ago. His smoking use included Cigarettes. He does not have any smokeless tobacco history on file. He reports that he does not drink alcohol or use illicit drugs.  Allergies  Allergen Reactions  . Sulfa Antibiotics Rash    Family History  Problem Relation Age of Onset  . Stroke Mother   . Stroke Father      Prior to Admission medications   Medication Sig Start Date End Date Taking? Authorizing Provider  acetaminophen (TYLENOL) 500 MG tablet Take 500 mg by mouth 2 (two) times daily as needed for moderate pain.   Yes Historical Provider, [name]  aspirin 81 MG chewable tablet Chew 81 mg by mouth every morning.   Yes Historical Provider, [name]  benazepril-hydrochlorthiazide (LOTENSIN HCT) 20-12.5 MG per tablet Take 1 tablet by mouth every morning.    Yes Historical Provider, [name]  divalproex (DEPAKOTE ER) 500 MG 24 hr tablet Take 500 mg by mouth daily.    Yes Historical Provider, [name]  ENSURE (ENSURE) Take 237 mLs by mouth 3 (three)  times daily between meals.   Yes Historical Provider, [name]  Glycerin-Hypromellose-PEG 400 (VISINE PURE TEARS) 0.2-0.2-1 % SOLN Place 1 drop into both eyes 2 (two) times daily.   Yes Historical Provider, [name]  latanoprost (XALATAN) 0.005 % ophthalmic solution Place 1 drop into both eyes at bedtime.   Yes Historical Provider, [name]  loratadine (CLARITIN) 10 MG tablet Take 10 mg by mouth every morning.   Yes Historical Provider, [name]  LORazepam (ATIVAN) 0.5 MG tablet Take 0.5 mg by mouth 2 (two) times daily.   Yes Historical Provider, [name]  mirtazapine (REMERON) 30 MG tablet Take 30 mg by mouth at bedtime.   Yes Historical Provider, [name]  Multiple Vitamin (MULTIVITAMIN WITH MINERALS) TABS tablet Take 1 tablet by mouth every morning.   Yes Historical Provider, [name]  pravastatin (PRAVACHOL) 10 MG tablet Take 10 mg by mouth at bedtime.    Yes Historical Provider, [name]  QUEtiapine (SEROQUEL) 50 MG tablet Take 50-100 mg by mouth 2 (two) times daily. 50mg  in the morning and 100 mg at bedtime   Yes Historical Provider, [name]   Physical Exam: Filed Vitals:   04/15/15 2328  BP: 110/60  Pulse:   Temp:     BP 110/60 mmHg  Pulse 87  Temp(Src) 97.8 F (36.6 C) (Rectal)  SpO2 94%  General Appearance:    Alert, demented, no distress, appears stated  age  Head:    Normocephalic, atraumatic  Eyes:    PERRL, EOMI, sclera non-icteric        Nose:   Nares without drainage or epistaxis. Mucosa, turbinates normal  Throat:   Moist mucous membranes. Oropharynx without erythema or exudate.  Neck:   Supple. No carotid bruits.  No thyromegaly.  No lymphadenopathy.   Back:     No CVA tenderness, no spinal tenderness  Lungs:     Clear to auscultation bilaterally, without wheezes, rhonchi or rales  Chest wall:    No tenderness to palpitation  Heart:    Regular rate and rhythm without murmurs, gallops, rubs  Abdomen:     Soft, non-tender, nondistended, normal bowel sounds, no organomegaly  Genitalia:    deferred  Rectal:    deferred   Extremities:   No clubbing, cyanosis or edema.  Pulses:   2+ and symmetric all extremities  Skin:   Skin color, texture, turgor normal, no rashes or lesions  Lymph nodes:   Cervical, supraclavicular, and axillary nodes normal  Neurologic:   MAE, demented    Labs on Admission:  Basic Metabolic Panel:  Recent Labs Lab 04/15/15 2101 04/15/15 2203  NA 138 140  K 4.3 4.8  CL 102 100*  CO2 29  --   GLUCOSE 133* 135*  BUN 45* 57*  CREATININE 2.02* 1.80*  CALCIUM 9.0  --    Liver Function Tests:  Recent Labs Lab 04/15/15 2101  AST 22  ALT 13*  ALKPHOS 51  BILITOT 0.7  PROT 6.8  ALBUMIN 3.8   No results for input(s): LIPASE, AMYLASE in the last 168 hours. No results for input(s): AMMONIA in the last 168 hours. CBC:  Recent Labs Lab 04/15/15 2101 04/15/15 2203  WBC 8.5  --   NEUTROABS 7.3  --   HGB 11.2* 10.9*  HCT 33.5* 32.0*  MCV 94.4  --   PLT 199  --    Cardiac Enzymes: No results for input(s): CKTOTAL, CKMB, CKMBINDEX, TROPONINI in the last 168 hours.  BNP (last 3 results) No results for input(s): PROBNP in the last 8760 hours. CBG: No results for input(s): GLUCAP in the last 168 hours.  Radiological Exams on Admission: Ct Abdomen Pelvis Wo Contrast  04/15/2015  CLINICAL DATA:  Rectal bleeding.  Blood in the stool.  Fever. EXAM: CT ABDOMEN AND PELVIS WITHOUT CONTRAST TECHNIQUE: Multidetector CT imaging of the abdomen and pelvis was performed following the standard protocol without IV contrast. COMPARISON:  Previous examinations, the most recent dated 08/22/2005. FINDINGS: Lower chest:  Interval mild atelectasis/scarring at both lung bases. Hepatobiliary: Small calcified granuloma in the liver. Poorly distended, normal appearing gallbladder. Pancreas: Diffuse atrophy.  No focal abnormality. Spleen: Within normal limits in size. Adrenals/Urinary Tract: No evidence of urolithiasis or hydronephrosis. No definite mass visualized on this un-enhanced exam.  Stomach/Bowel: Diffuse low-density wall thickening involving the rectum and distal sigmoid colon. Normal appearing appendix. No gastric or small bowel abnormalities. Vascular/Lymphatic: Status post resection of the previously seen distal abdominal aortic aneurysm with an aorto bi-iliac graft. Atheromatous arterial calcifications. No enlarged lymph nodes. Reproductive: Normal sized prostate gland. Other: Small bilateral inguinal hernias containing fat, larger on the left. Anterior abdominal wall hernia repair mesh anchors. Musculoskeletal: Mild scoliosis. Lumbar and lower thoracic spine degenerative changes. IMPRESSION: 1. Proctitis and distal sigmoid colitis. This is most likely infectious or inflammatory in nature. 2. Small bilateral inguinal hernias containing fat, larger on the left. Electronically Signed   By: Viviann Spare  Azucena Kuba M.D.   On: 04/15/2015 23:32    EKG: Independently reviewed.  Assessment/Plan Principal Problem:   Colitis presumed infectious Active Problems:   Essential hypertension   Diarrhea   AKI (acute kidney injury) (HCC)   1. Colitis presumed to be infectious -  1. Empiric cipro/flagyl 2. Stool studies pending including C.Diff 3. Repeat CBC in AM due to hemorrhagic diarrhea to ensure no large volume of blood loss 2. HTN - continue home meds except for ACEI / HCTZ 3. AKI - pre-renal 1. IVF 2. Hold ACEI / HCTZ 3. Repeat BMP in AM    Code Status: Full  Family Communication: Family at bedside Disposition Plan: Admit to inpatient   Time spent: 70 min  GARDNER, JARED M. [facility] Pager [phone]  If 7AM-7PM, please contact the day team taking care of the patient [email or web address] Password [email or web address] 04/16/2015, 12:02 AM

## 2015-04-16 NOTE — ED Provider Notes (Signed)
CSN: [id number]     Arrival date & time 04/15/15  2045 History   First MD Initiated Contact with Patient 04/15/15 2059     No chief complaint on file.    (Consider location/radiation/quality/duration/timing/severity/associated sxs/prior Treatment) Patient is a 81 y.o. male presenting with diarrhea. The history is provided by a relative.  Diarrhea Quality:  Bloody and watery Severity:  Moderate Onset quality:  Gradual Duration:  2 days Timing:  Constant Progression:  Worsening Relieved by:  Nothing Worsened by:  Nothing tried Ineffective treatments:  None tried Associated symptoms: abdominal pain, chills and fever   Associated symptoms: no vomiting   Risk factors: no recent antibiotic use and no suspicious food intake   Risk factors comment:  Nursing facility   Past Medical History  Diagnosis Date  . 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 COPD 11/05/2006  . DEMENTIA 12/19/2009  . DEPRESSION 11/05/2006  . DYSLIPIDEMIA 06/17/2008  . DYSURIA, HX OF 07/29/2008  . HERNIA 10/22/2007  . HX, PERSONAL, ALCOHOLISM 11/05/2006  . HYPERTENSION 11/05/2006  . LOW BACK PAIN 11/05/2006  . TOBACCO ABUSE 06/17/2008  . Bronchitis   . Alzheimer disease 2011   Past Surgical History  Procedure Laterality Date  . Aortobifem  2007  . Glaucoma repair      Left eye  . Pilonidal cyst excision    . Abdominal aortic aneurysm repair  Jan. 2003    Stent Graft   Family History  Problem Relation Age of Onset  . Stroke Mother   . Stroke Father    Social History  Substance Use Topics  . Smoking status: Former Smoker    Types: Cigarettes    Quit date: 06/11/2008  . Smokeless tobacco: None  . Alcohol Use: No    Review of Systems  Constitutional: Positive for fever and chills.  Gastrointestinal: Positive for abdominal pain and diarrhea. Negative for vomiting.  All other systems reviewed and are negative.     Allergies  Sulfa antibiotics  Home Medications   Prior to Admission  medications   Medication Sig Start Date End Date Taking? Authorizing Provider  acetaminophen (TYLENOL) 500 MG tablet Take 500 mg by mouth 2 (two) times daily as needed for moderate pain.   Yes Historical Provider, MD  aspirin 81 MG chewable tablet Chew 81 mg by mouth every morning.   Yes Historical Provider, MD  benazepril-hydrochlorthiazide (LOTENSIN HCT) 20-12.5 MG per tablet Take 1 tablet by mouth every morning.    Yes Historical Provider, MD  divalproex (DEPAKOTE ER) 500 MG 24 hr tablet Take 500 mg by mouth daily.    Yes Historical Provider, MD  ENSURE (ENSURE) Take 237 mLs by mouth 3 (three) times daily between meals.   Yes Historical Provider, MD  Glycerin-Hypromellose-PEG 400 (VISINE PURE TEARS) 0.2-0.2-1 % SOLN Place 1 drop into both eyes 2 (two) times daily.   Yes Historical Provider, MD  latanoprost (XALATAN) 0.005 % ophthalmic solution Place 1 drop into both eyes at bedtime.   Yes Historical Provider, MD  loratadine (CLARITIN) 10 MG tablet Take 10 mg by mouth every morning.   Yes Historical Provider, MD  LORazepam (ATIVAN) 0.5 MG tablet Take 0.5 mg by mouth 2 (two) times daily.   Yes Historical Provider, MD  mirtazapine (REMERON) 30 MG tablet Take 30 mg by mouth at bedtime.   Yes Historical Provider, MD  Multiple Vitamin (MULTIVITAMIN WITH MINERALS) TABS tablet Take 1 tablet by mouth every morning.   Yes Historical Provider, MD  pravastatin (PRAVACHOL) 10 MG  tablet Take 10 mg by mouth at bedtime.    Yes Historical Provider, MD  QUEtiapine (SEROQUEL) 50 MG tablet Take 50-100 mg by mouth 2 (two) times daily. 50mg  in the morning and 100 mg at bedtime   Yes Historical Provider, MD   BP 113/52 mmHg  Pulse 96  Temp(Src) 97.9 F (36.6 C) (Oral)  Resp 22  Ht 5\' 7"  (1.702 m)  Wt 113 lb 14.4 oz (51.665 kg)  BMI 17.84 kg/m2  SpO2 97% Physical Exam  Constitutional: He is oriented to person, place, and time. He appears well-developed and well-nourished. No distress.  HENT:  Head:  Normocephalic and atraumatic.  Eyes: Conjunctivae are normal.  Neck: Neck supple. No tracheal deviation present.  Cardiovascular: Normal rate, regular rhythm and normal heart sounds.   Pulmonary/Chest: Effort normal and breath sounds normal. No respiratory distress.  Abdominal: Soft. He exhibits no distension. There is tenderness (diffuse lower abdomen). There is no rebound and no guarding.  Neurological: He is alert and oriented to person, place, and time.  Skin: Skin is warm and dry.  Psychiatric: He has a normal mood and affect.    ED Course  Procedures (including critical care time) Labs Review Labs Reviewed  CBC WITH DIFFERENTIAL/PLATELET - Abnormal; Notable for the following:    RBC 3.55 (*)    Hemoglobin 11.2 (*)    HCT 33.5 (*)    Lymphs Abs 0.5 (*)    All other components within normal limits  COMPREHENSIVE METABOLIC PANEL - Abnormal; Notable for the following:    Glucose, Bld 133 (*)    BUN 45 (*)    Creatinine, Ser 2.02 (*)    ALT 13 (*)    GFR calc non Af Amer 30 (*)    GFR calc Af Amer 34 (*)    All other components within normal limits  URINALYSIS, ROUTINE W REFLEX MICROSCOPIC (NOT AT [facility]) - Abnormal; Notable for the following:    Color, Urine AMBER (*)    APPearance CLOUDY (*)    Bilirubin Urine SMALL (*)    All other components within normal limits  CBC - Abnormal; Notable for the following:    RBC 3.42 (*)    Hemoglobin 10.4 (*)    HCT 31.7 (*)    All other components within normal limits  BASIC METABOLIC PANEL - Abnormal; Notable for the following:    Glucose, Bld 109 (*)    BUN 32 (*)    Creatinine, Ser 1.37 (*)    Calcium 8.5 (*)    GFR calc non Af Amer 47 (*)    GFR calc Af Amer 55 (*)    All other components within normal limits  I-STAT CG4 LACTIC ACID, ED - Abnormal; Notable for the following:    Lactic Acid, Venous 2.22 (*)    All other components within normal limits  I-STAT CHEM 8, ED - Abnormal; Notable for the following:    Chloride 100  (*)    BUN 57 (*)    Creatinine, Ser 1.80 (*)    Glucose, Bld 135 (*)    Hemoglobin 10.9 (*)    HCT 32.0 (*)    All other components within normal limits  CULTURE, BLOOD (ROUTINE X 2)  CULTURE, BLOOD (ROUTINE X 2)  URINE CULTURE  C DIFFICILE QUICK SCREEN W PCR REFLEX  GI PATHOGEN PANEL BY PCR, STOOL  POC OCCULT BLOOD, ED    Imaging Review Ct Abdomen Pelvis Wo Contrast  04/15/2015  CLINICAL DATA:  Rectal  bleeding.  Blood in the stool.  Fever. EXAM: CT ABDOMEN AND PELVIS WITHOUT CONTRAST TECHNIQUE: Multidetector CT imaging of the abdomen and pelvis was performed following the standard protocol without IV contrast. COMPARISON:  Previous examinations, the most recent dated 08/22/2005. FINDINGS: Lower chest:  Interval mild atelectasis/scarring at both lung bases. Hepatobiliary: Small calcified granuloma in the liver. Poorly distended, normal appearing gallbladder. Pancreas: Diffuse atrophy.  No focal abnormality. Spleen: Within normal limits in size. Adrenals/Urinary Tract: No evidence of urolithiasis or hydronephrosis. No definite mass visualized on this un-enhanced exam. Stomach/Bowel: Diffuse low-density wall thickening involving the rectum and distal sigmoid colon. Normal appearing appendix. No gastric or small bowel abnormalities. Vascular/Lymphatic: Status post resection of the previously seen distal abdominal aortic aneurysm with an aorto bi-iliac graft. Atheromatous arterial calcifications. No enlarged lymph nodes. Reproductive: Normal sized prostate gland. Other: Small bilateral inguinal hernias containing fat, larger on the left. Anterior abdominal wall hernia repair mesh anchors. Musculoskeletal: Mild scoliosis. Lumbar and lower thoracic spine degenerative changes. IMPRESSION: 1. Proctitis and distal sigmoid colitis. This is most likely infectious or inflammatory in nature. 2. Small bilateral inguinal hernias containing fat, larger on the left. Electronically Signed   By: Beckie Salts M.D.    On: 04/15/2015 23:32   I have personally reviewed and evaluated these images and lab results as part of my medical decision-making.   EKG Interpretation None      MDM   Final diagnoses:  Colitis  Lactic acidosis  AKI (acute kidney injury) (HCC)    81 y.o. male presents with ongoing diarrhea and fever for last 2 days. Has h/o GI bleeding previously and presents with brbpr during bowel movements today. Lactate elevated and Pt with AKI at 50% of baseline GFR suggesting volume contraction vs severe sepsis, hemoglobin is stable from previous readings. CT with evidence of likely infectious colitis. covered with cipro/flagyl IV. Hospitalist was consulted for admission and will see the patient in the emergency department.    Lyndal Pulley, MD 04/16/15 [phone]

## 2015-04-16 NOTE — Progress Notes (Signed)
Pt admitted after midnight, Admission for colitis - Pt seen and examined at the bedside - Admission for colitis - Pt on cipro and flagyl  Manson Passeylma [facility] [phone]

## 2015-04-16 NOTE — Progress Notes (Signed)
Utilization Review Completed.[name] T11/10/2014  

## 2015-04-17 DIAGNOSIS — E785 Hyperlipidemia, unspecified: Secondary | ICD-10-CM

## 2015-04-17 DIAGNOSIS — G9340 Encephalopathy, unspecified: Secondary | ICD-10-CM

## 2015-04-17 DIAGNOSIS — N179 Acute kidney failure, unspecified: Secondary | ICD-10-CM

## 2015-04-17 DIAGNOSIS — I10 Essential (primary) hypertension: Secondary | ICD-10-CM

## 2015-04-17 DIAGNOSIS — F418 Other specified anxiety disorders: Secondary | ICD-10-CM

## 2015-04-17 LAB — URINE CULTURE: Culture: NO GROWTH

## 2015-04-17 MED ORDER — LORAZEPAM 2 MG/ML IJ SOLN: 1.0000 mg | Freq: Four times a day (QID) | INTRAMUSCULAR | Status: DC | PRN

## 2015-04-17 MED ORDER — LORAZEPAM 2 MG/ML IJ SOLN
0.5000 mg | Freq: Once | INTRAMUSCULAR | Status: AC
  Administered 2015-04-17: 0.5 mg via INTRAVENOUS
  Filled 2015-04-17: qty 1

## 2015-04-17 MED ORDER — LORAZEPAM 2 MG/ML IJ SOLN
INTRAMUSCULAR | Status: AC
  Administered 2015-04-17: 1 mg
  Filled 2015-04-17: qty 1

## 2015-04-17 MED ORDER — HALOPERIDOL LACTATE 5 MG/ML IJ SOLN
2.0000 mg | Freq: Four times a day (QID) | INTRAMUSCULAR | Status: DC | PRN
  Administered 2015-04-17 (×2): 2 mg via INTRAVENOUS
  Filled 2015-04-17 (×2): qty 1

## 2015-04-17 NOTE — Progress Notes (Signed)
Pt has become increasingly agitated and combative. Mitten restraints placed because of tugging to IV tubing .Attending notified. New orders for PRN Ativan given. Will continue to monitor pt.

## 2015-04-17 NOTE — Progress Notes (Signed)
[facility] PROGRESS NOTE  KAIRO LAUBACHER ZOX:[id number] DOB: February 19, 1934 DOA: 04/15/2015 PCP: Florentina Jenny, MD  Brief narrative:    81 year old male with past medical history of hypertension, dyslipidemia, depression who presented to [facility] ED with bloody diarrhea for past 2 day prior to this admission.  Anticipated discharge: Needs PT eval for safe discharge plan.  Assessment/Plan:    Principal Problem:   Colitis presumed infectious / Proctitis - Continue cipro and flagyl - Diarrhea resolved - C.diff not collected since no further BM - Continue to provide supportive care with fluids  Active Problems:   Essential hypertension - Lotensin on hold since BP controlled without BP meds    Acute renal failure - Likely from Lotensin which was placed on hold - Continue IV fluids. Cr improving with IV fluids  - Follow up renal function in am    Acute encephalopathy / Depression - Use haldol PRN for agitation, restlessness  - Continue Seroquel     Dyslipidemia - Continue Pravachol    DVT Prophylaxis  - SCD's bilaterally   Code Status: Full.  Family Communication:  plan of care discussed with the patient, no family at the bedside  Disposition Plan: needs PT eval   IV access:  Peripheral IV  Procedures and diagnostic studies:    Ct Abdomen Pelvis Wo Contrast 04/15/2015 1. Proctitis and distal sigmoid colitis. This is most likely infectious or inflammatory in nature. 2. Small bilateral inguinal hernias containing fat, larger on the left. Electronically Signed   By: Beckie Salts M.D.   On: 04/15/2015 23:32    Medical Consultants:  None   Other Consultants:  PT Nutrition  IAnti-Infectives:   Cipro 04/16/2015 --> Flagyl 04/16/2015 -->   Manson Passey, MD  [facility] Pager [phone]  Time spent in minutes: 25 minutes  If 7PM-7AM, please contact night-coverage [email or web address] Password [email or web address] 04/17/2015, 1:17 PM   LOS: 2 days    HPI/Subjective: Pt  agitated.  Objective: Filed Vitals:   04/16/15 0432 04/16/15 1132 04/16/15 2139 04/17/15 0610  BP: 113/52  110/60 117/60  Pulse: 96  84 86  Temp: 97.9 F (36.6 C)  98.2 F (36.8 C) 98 F (36.7 C)  TempSrc: Oral  Oral Oral  Resp: Height:  (1.702 m)     Weight: 70.308 kg (155 lb) 51.665 kg (113 lb 14.4 oz)    SpO2: 97%  99% 95%    Intake/Output Summary (Last 24 hours) at 04/17/15 1317 Last data filed at 04/17/15 0900  Gross per 24 hour  Intake    480 ml  Output   1375 ml  Net   -895 ml    Exam:   General:  Pt is alert, not in acute distress  Cardiovascular: Regular rate and rhythm, S1/S2, no murmurs  Respiratory: Clear to auscultation bilaterally, no wheezing, no crackles, no rhonchi  Abdomen: Soft, non tender, non distended, bowel sounds present  Extremities: No edema, pulses DP and PT palpable bilaterally  Neuro: Grossly nonfocal  Data Reviewed: Basic Metabolic Panel:  Recent Labs Lab 04/15/15 2101 04/15/15 2203 04/16/15 0515  NA 138 140 138  K 4.3 4.8 3.9  CL 102 100* 108  CO2 29  --  23  GLUCOSE 133* 135* 109*  BUN 45* 57* 32*  CREATININE 2.02* 1.80* 1.37*  CALCIUM 9.0  --  8.5*   Liver Function Tests:  Recent Labs Lab 04/15/15 2101  AST 22  ALT 13*  ALKPHOS 51  BILITOT 0.7  PROT 6.8  ALBUMIN 3.8   No results for input(s): LIPASE, AMYLASE in the last 168 hours. No results for input(s): AMMONIA in the last 168 hours. CBC:  Recent Labs Lab 04/15/15 2101 04/15/15 2203 04/16/15 0515  WBC 8.5  --  6.4  NEUTROABS 7.3  --   --   HGB 11.2* 10.9* 10.4*  HCT 33.5* 32.0* 31.7*  MCV 94.4  --  92.7  PLT 199  --  173   Cardiac Enzymes: No results for input(s): CKTOTAL, CKMB, CKMBINDEX, TROPONINI in the last 168 hours. BNP: Invalid input(s): POCBNP CBG: No results for input(s): GLUCAP in the last 168 hours.  Recent Results (from the past 240 hour(s))  Blood culture (routine x 2)     Status: None (Preliminary result)    Collection Time: 04/15/15  9:06 PM  Result Value Ref Range Status   Specimen Description BLOOD RIGHT ARM  Final   Special Requests BOTTLES DRAWN AEROBIC AND ANAEROBIC 5CC  Final   Culture   Final    NO GROWTH 1 DAY Performed at [facility]    Report Status PENDING  Incomplete  Blood culture (routine x 2)     Status: None (Preliminary result)   Collection Time: 04/15/15 10:00 PM  Result Value Ref Range Status   Specimen Description BLOOD LEFT HAND  Final   Special Requests BOTTLES DRAWN AEROBIC AND ANAEROBIC 5 ML  Final   Culture   Final    NO GROWTH 1 DAY [facility]formed [facility]    Report Status PENDING  Incomplete  Urine culture     Status: None   Collection Time: 04/16/15 12:55 AM  Result Value Ref Range Status   Specimen Description URINE, RANDOM  Final   Special Requests NONE  Final   Culture   Final    NO GROWTH 1 DAY Performed at [facility]     Report Status 04/17/2015 FINAL  Final     Scheduled Meds: . ciprofloxacin  400 mg Intravenous Q12H  . divalproex  500 mg Oral Daily  . feeding supplement (ENSURE ENLIVE)  237 mL Oral TID BM  . latanoprost  1 drop Both Eyes QHS  . loratadine  10 mg Oral q morning - 10a  . metronidazole  500 mg Intravenous Q8H  . mirtazapine  30 mg Oral QHS  . multivitamin with minerals  1 tablet Oral q morning - 10a  . polyvinyl alcohol  1 drop Both Eyes BID  . pravastatin  10 mg Oral QHS  . QUEtiapine  100 mg Oral QHS  . QUEtiapine  50 mg Oral Daily   Continuous Infusions: . sodium chloride 125 mL/hr at 04/17/15 1045

## 2015-04-17 NOTE — Clinical Social Work Note (Signed)
Clinical Social Work Assessment  Patient Details  Name: [name] MRN: [id number] Date of Birth: 01-16-1934  Date of referral:  04/17/15               Reason for consult:  Facility Placement                Permission sought to share information with:  Facility Medical sales representative, Family Supports Permission granted to share information::  Yes, Verbal Permission Granted  Name::        Agency::     Relationship::     Contact Information:     Housing/Transportation Living arrangements for the past 2 months:  Assisted Living Facility [facility] memory care unit) Source of Information:  Adult Children Ship broker) Patient Interpreter Needed:    Criminal Activity/Legal Involvement Pertinent to Current Situation/Hospitalization:    Significant Relationships:  Adult Children, Spouse Lives with:  Facility Resident [facility] ) Do you feel safe going back to the place where you live?    Need for family participation in patient care:  Yes (Comment)  Care giving concerns:  No concerns   Office manager / plan:  CSW attempted to meet with pt at bedside but he was angry and confused.  Per pt chart the hospital needed to put him in restraints due to his agitation.  CSW called and spoke with pt's daughter [name] who along with pt's spouse is his legal guardian.  CSW will send pt information to [facility] which is the memory care unit that pt has been residing at past 5 years.     Employment status:  Retired Health and safety inspector:  Managed Care PT Recommendations:  Not assessed at this time Information / Referral to community resources:     Patient/Family's Response to care:  Pt unable to provide information due to alzheimer's diagnosis.  Pt's daughter stated that pt has been residing at [facility] on [address] for past 5 years due to his alzheimer diagnosis.  Pt's daughter stated that pt's wife was separated from pt when he  was placed in ALF but she and the daughter are both pt's legal guardian.  Pt's daughter stated that pt is in the late stage of alzheimer and when he gets confused he "acts out".  Pt's daughter stated that the plan is to go back to [facility] at discharge as pt is happy there and they know how to work with him and his behaviors.  Pt's daughter stated that pt has not had these angry episodes in a long time and feels that when pt is in new place and or confused he gets angry.  Pt's facility is able to give him "extra medications to calm him down."  Pt's daughter asked that CSW send pt home in ambulance and call her to meet him at facility so she can check in back in with some of his belongings and help with the transition back.  Patient/Family's Understanding of and Emotional Response to Diagnosis, Current Treatment, and Prognosis:  Pt angry and confused due to his alzheimer's diagnosis and being in a new place (hospital).  Pt's daughter understands the reason for the restraints and stated that pt does not need them when he is at his facility.  Pt's daughter hopeful that pt will regain his health soon and return to his home/[facility].   Emotional Assessment Appearance:  Appears stated age Attitude/Demeanor/Rapport:  Angry Affect (typically observed):  Afraid/Fearful Orientation:  Oriented to Self,  Fluctuating Orientation (Suspected and/or reported Sundowners) Alcohol / Substance use:    Psych involvement (Current and /or in the community):     Discharge Needs  Concerns to be addressed:    Readmission within the last 30 days:    Current discharge risk:    Barriers to Discharge:  No Barriers Identified   Annetta MawKujawa,Alexyia Guarino G, LCSW 04/17/2015, 1:28 PM

## 2015-04-17 NOTE — NC FL2 (Signed)
[place] MEDICAID FL2 LEVEL OF CARE SCREENING TOOL     IDENTIFICATION  Patient Name: [name] Birthdate: 12-07-1933 Sex: male Admission Date (Current Location): 04/15/2015  [facility] and [place] Number: Producer, television/film/video and Address:  [facility],  [address], [place] [address]      Provider Number: [id number]  Attending Physician Name and Address:  Alison Murray, MD  Relative Name and Phone Number:       Current Level of Care: Hospital Recommended Level of Care: Memory Care ([facility]) Prior Approval Number:    Date Approved/Denied:   PASRR Number:    Discharge Plan: Other (Comment) (memory care)    Current Diagnoses: Patient Active Problem List   Diagnosis Date Noted  . Colitis presumed infectious 04/15/2015  . Diarrhea 04/15/2015  . AKI (acute kidney injury) (HCC) 04/15/2015  . Occlusion and stenosis of carotid artery without mention of cerebral infarction 01/16/2012  . DEMENTIA 12/19/2009  . 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 DYSURIA, HX OF 07/29/2008  . DYSLIPIDEMIA 06/17/2008  . TOBACCO ABUSE 06/17/2008  . HERNIA 10/22/2007  . DEPRESSION 11/05/2006  . Essential hypertension 11/05/2006  . COPD 11/05/2006  . LOW BACK PAIN 11/05/2006  . HX, PERSONAL, ALCOHOLISM 11/05/2006    Orientation ACTIVITIES/SOCIAL BLADDER RESPIRATION    Self    Continent Normal  BEHAVIORAL SYMPTOMS/MOOD NEUROLOGICAL BOWEL NUTRITION STATUS      Incontinent    PHYSICIAN VISITS COMMUNICATION OF NEEDS Height & Weight Skin    Verbally  (170.2 cm) 113 lbs. Normal          AMBULATORY STATUS RESPIRATION    Supervision limited Normal      Personal Care Assistance Level of Assistance  Bathing, Feeding, Dressing Bathing Assistance: Limited assistance Feeding assistance: Limited assistance Dressing Assistance: Limited assistance      Functional Limitations Info                SPECIAL CARE FACTORS FREQUENCY  Restraints               Restraints Frequency: placed mittens was pulling at IV   Additional Factors Info  Code Status, Allergies Code Status Info: Full code Allergies Info: Allergies: Sulfa Antibiotics           Current Medications (04/17/2015): Current Facility-Administered Medications  Medication Dose Route Frequency Provider Last Rate Last Dose  . 0.9 %  sodium chloride infusion   Intravenous Continuous Hillary Bow, DO 125 mL/hr at 04/17/15 1045    . acetaminophen (TYLENOL) tablet 500 mg  500 mg Oral BID PRN Hillary Bow, DO   500 mg at 04/16/15 0232  . ciprofloxacin (CIPRO) IVPB 400 mg  400 mg Intravenous Q12H Leann T Poindexter, RPH   400 mg at 04/17/15 0400  . divalproex (DEPAKOTE ER) 24 hr tablet 500 mg  500 mg Oral Daily Hillary Bow, DO   500 mg at 04/16/15 1113  . feeding supplement (ENSURE ENLIVE) (ENSURE ENLIVE) liquid 237 mL  237 mL Oral TID BM Hillary Bow, DO   237 mL at 04/16/15 1809  . haloperidol lactate (HALDOL) injection 2 mg  2 mg Intravenous Q6H PRN Alison Murray, MD   2 mg at 04/17/15 0906  . latanoprost (XALATAN) 0.005 % ophthalmic solution 1 drop  1 drop Both Eyes QHS Hillary Bow, DO   1 drop at 04/16/15 2228  . loratadine (CLARITIN) tablet 10 mg  10 mg Oral q morning - 10a  Hillary BowJared M Gardner, DO   10 mg at 04/17/15 1045  . metroNIDAZOLE (FLAGYL) IVPB 500 mg  500 mg Intravenous Q8H Hillary BowJared M Gardner, DO   500 mg at 04/17/15 1118  . mirtazapine (REMERON) tablet 30 mg  30 mg Oral QHS Hillary BowJared M Gardner, DO   30 mg at 04/16/15 2227  . multivitamin with minerals tablet 1 tablet  1 tablet Oral q morning - 10a Hillary BowJared M Gardner, DO   1 tablet at 04/16/15 1112  . polyvinyl alcohol (LIQUIFILM TEARS) 1.4 % ophthalmic solution 1 drop  1 drop Both Eyes BID Lyndal Pulleyaniel Knott, MD   1 drop at 04/17/15 1051  . pravastatin (PRAVACHOL) tablet 10 mg  10 mg Oral QHS Hillary BowJared M Gardner, DO   10 mg at 04/16/15 2228  . QUEtiapine (SEROQUEL) tablet 100 mg  100 mg Oral QHS Hillary BowJared M Gardner, DO   100  mg at 04/16/15 2228  . QUEtiapine (SEROQUEL) tablet 50 mg  50 mg Oral Daily Hillary BowJared M Gardner, DO   50 mg at 04/17/15 1045   Do not use this list as official medication orders. Please verify with discharge summary.  Discharge Medications:   Medication List    ASK your doctor about these medications        acetaminophen 500 MG tablet  Commonly known as:  TYLENOL  Take 500 mg by mouth 2 (two) times daily as needed for moderate pain.     aspirin 81 MG chewable tablet  Chew 81 mg by mouth every morning.     benazepril-hydrochlorthiazide 20-12.5 MG tablet  Commonly known as:  LOTENSIN HCT  Take 1 tablet by mouth every morning.     divalproex 500 MG 24 hr tablet  Commonly known as:  DEPAKOTE ER  Take 500 mg by mouth daily.     ENSURE  Take 237 mLs by mouth 3 (three) times daily between meals.     latanoprost 0.005 % ophthalmic solution  Commonly known as:  XALATAN  Place 1 drop into both eyes at bedtime.     loratadine 10 MG tablet  Commonly known as:  CLARITIN  Take 10 mg by mouth every morning.     LORazepam 0.5 MG tablet  Commonly known as:  ATIVAN  Take 0.5 mg by mouth 2 (two) times daily.     mirtazapine 30 MG tablet  Commonly known as:  REMERON  Take 30 mg by mouth at bedtime.     multivitamin with minerals Tabs tablet  Take 1 tablet by mouth every morning.     pravastatin 10 MG tablet  Commonly known as:  PRAVACHOL  Take 10 mg by mouth at bedtime.     QUEtiapine 50 MG tablet  Commonly known as:  SEROQUEL  Take 50-100 mg by mouth 2 (two) times daily. 50mg  in the morning and 100 mg at bedtime     VISINE PURE TEARS 0.2-0.2-1 % Soln  Generic drug:  Glycerin-Hypromellose-PEG 400  Place 1 drop into both eyes 2 (two) times daily.        Relevant Imaging Results:  Relevant Lab Results:  Recent Labs    Additional Information    Annetta MawKujawa,Jeret Goyer G, LCSW

## 2015-04-18 DIAGNOSIS — R451 Restlessness and agitation: Secondary | ICD-10-CM

## 2015-04-18 LAB — C DIFFICILE QUICK SCREEN W PCR REFLEX
C DIFFICILE (CDIFF) INTERP: NEGATIVE
C DIFFICILE (CDIFF) TOXIN: NEGATIVE
C Diff antigen: NEGATIVE

## 2015-04-18 MED ORDER — METRONIDAZOLE 500 MG PO TABS
500.0000 mg | ORAL_TABLET | Freq: Three times a day (TID) | ORAL | Status: DC
  Administered 2015-04-18 – 2015-04-20 (×4): 500 mg via ORAL
  Filled 2015-04-18 (×9): qty 1

## 2015-04-18 MED ORDER — HALOPERIDOL 2 MG PO TABS
2.0000 mg | ORAL_TABLET | Freq: Four times a day (QID) | ORAL | Status: DC | PRN
  Administered 2015-04-18 – 2015-04-20 (×4): 2 mg via ORAL
  Filled 2015-04-18 (×5): qty 1

## 2015-04-18 MED ORDER — CIPROFLOXACIN HCL 500 MG PO TABS
500.0000 mg | ORAL_TABLET | Freq: Two times a day (BID) | ORAL | Status: DC
  Administered 2015-04-19 – 2015-04-20 (×3): 500 mg via ORAL
  Filled 2015-04-18 (×5): qty 1

## 2015-04-18 NOTE — NC FL2 (Signed)
[place] MEDICAID FL2 LEVEL OF CARE SCREENING TOOL     IDENTIFICATION  Patient Name: [name] Birthdate: [birth year] Sex: male Admission Date (Current Location): 04/15/2015  [facility] and [place] Number: Producer, television/film/video and Address:  [facility],  [address] [place] [address]      Provider Number: [id number]  Attending Physician Name and Address:  Alison Murray, MD  Relative Name and Phone Number:       Current Level of Care: Hospital Recommended Level of Care: Memory Care ([facility]) Prior Approval Number:    Date Approved/Denied:   PASRR Number:    Discharge Plan: Other (Comment) (memory care)    Current Diagnoses: Patient Active Problem List   Diagnosis Date Noted  . Colitis presumed infectious 04/15/2015  . Diarrhea 04/15/2015  . AKI (acute kidney injury) (HCC) 04/15/2015  . Occlusion and stenosis of carotid artery without mention of cerebral infarction 01/16/2012  . DEMENTIA 12/19/2009  . 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 DYSURIA, HX OF 07/29/2008  . DYSLIPIDEMIA 06/17/2008  . TOBACCO ABUSE 06/17/2008  . HERNIA 10/22/2007  . DEPRESSION 11/05/2006  . Essential hypertension 11/05/2006  . COPD 11/05/2006  . LOW BACK PAIN 11/05/2006  . HX, PERSONAL, ALCOHOLISM 11/05/2006    Orientation ACTIVITIES/SOCIAL BLADDER RESPIRATION    Self    Continent Normal  BEHAVIORAL SYMPTOMS/MOOD NEUROLOGICAL BOWEL NUTRITION STATUS      Incontinent    PHYSICIAN VISITS COMMUNICATION OF NEEDS Height & Weight Skin    Verbally  (170.2 cm) 113 lbs. Normal          AMBULATORY STATUS RESPIRATION    Supervision limited Normal      Personal Care Assistance Level of Assistance  Bathing, Feeding, Dressing Bathing Assistance: Limited assistance Feeding assistance: Limited assistance Dressing Assistance: Limited assistance      Functional Limitations Info                SPECIAL CARE FACTORS FREQUENCY  Restraints                Restraints Frequency: placed mittens was pulling at IV   Additional Factors Info  Code Status, Allergies Code Status Info: Full code Allergies Info: Allergies: Sulfa Antibiotics           Current Medications (04/18/2015): Current Facility-Administered Medications  Medication Dose Route Frequency Provider Last Rate Last Dose  . 0.9 %  sodium chloride infusion   Intravenous Continuous Hillary Bow, DO 125 mL/hr at 04/18/15 1119    . acetaminophen (TYLENOL) tablet 500 mg  500 mg Oral BID PRN Hillary Bow, DO   500 mg at 04/16/15 0232  . ciprofloxacin (CIPRO) IVPB 400 mg  400 mg Intravenous Q12H Leann T Poindexter, RPH   400 mg at 04/18/15 1514  . divalproex (DEPAKOTE ER) 24 hr tablet 500 mg  500 mg Oral Daily Hillary Bow, DO   500 mg at 04/18/15 1117  . feeding supplement (ENSURE ENLIVE) (ENSURE ENLIVE) liquid 237 mL  237 mL Oral TID BM Hillary Bow, DO   237 mL at 04/18/15 1514  . haloperidol lactate (HALDOL) injection 2 mg  2 mg Intravenous Q6H PRN Alison Murray, MD   2 mg at 04/17/15 1628  . latanoprost (XALATAN) 0.005 % ophthalmic solution 1 drop  1 drop Both Eyes QHS Hillary Bow, DO   1 drop at 04/17/15 2322  . loratadine (CLARITIN) tablet 10 mg  10 mg Oral q morning -  10a Hillary BowJared M Gardner, DO   10 mg at 04/18/15 1117  . metroNIDAZOLE (FLAGYL) IVPB 500 mg  500 mg Intravenous Q8H Hillary BowJared M Gardner, DO   500 mg at 04/18/15 1117  . mirtazapine (REMERON) tablet 30 mg  30 mg Oral QHS Hillary BowJared M Gardner, DO   30 mg at 04/17/15 2320  . multivitamin with minerals tablet 1 tablet  1 tablet Oral q morning - 10a Hillary BowJared M Gardner, DO   1 tablet at 04/18/15 1117  . polyvinyl alcohol (LIQUIFILM TEARS) 1.4 % ophthalmic solution 1 drop  1 drop Both Eyes BID Lyndal Pulleyaniel Knott, MD   1 drop at 04/18/15 1118  . pravastatin (PRAVACHOL) tablet 10 mg  10 mg Oral QHS Hillary BowJared M Gardner, DO   10 mg at 04/17/15 2322  . QUEtiapine (SEROQUEL) tablet 100 mg  100 mg Oral QHS Hillary BowJared M Gardner, DO   100  mg at 04/17/15 2321  . QUEtiapine (SEROQUEL) tablet 50 mg  50 mg Oral Daily Hillary BowJared M Gardner, DO   50 mg at 04/18/15 1117   Do not use this list as official medication orders. Please verify with discharge summary.  Discharge Medications:   Medication List    ASK your doctor about these medications        acetaminophen 500 MG tablet  Commonly known as:  TYLENOL  Take 500 mg by mouth 2 (two) times daily as needed for moderate pain.     aspirin 81 MG chewable tablet  Chew 81 mg by mouth every morning.     benazepril-hydrochlorthiazide 20-12.5 MG tablet  Commonly known as:  LOTENSIN HCT  Take 1 tablet by mouth every morning.     divalproex 500 MG 24 hr tablet  Commonly known as:  DEPAKOTE ER  Take 500 mg by mouth daily.     ENSURE  Take 237 mLs by mouth 3 (three) times daily between meals.     latanoprost 0.005 % ophthalmic solution  Commonly known as:  XALATAN  Place 1 drop into both eyes at bedtime.     loratadine 10 MG tablet  Commonly known as:  CLARITIN  Take 10 mg by mouth every morning.     LORazepam 0.5 MG tablet  Commonly known as:  ATIVAN  Take 0.5 mg by mouth 2 (two) times daily.     mirtazapine 30 MG tablet  Commonly known as:  REMERON  Take 30 mg by mouth at bedtime.     multivitamin with minerals Tabs tablet  Take 1 tablet by mouth every morning.     pravastatin 10 MG tablet  Commonly known as:  PRAVACHOL  Take 10 mg by mouth at bedtime.     QUEtiapine 50 MG tablet  Commonly known as:  SEROQUEL  Take 50-100 mg by mouth 2 (two) times daily. 50mg  in the morning and 100 mg at bedtime     VISINE PURE TEARS 0.2-0.2-1 % Soln  Generic drug:  Glycerin-Hypromellose-PEG 400  Place 1 drop into both eyes 2 (two) times daily.        Relevant Imaging Results:  Relevant Lab Results:  Recent Labs    Additional Information  SSN [id number]  Liliana ClineCaldwell, Torrell Krutz Parks, LCSW

## 2015-04-18 NOTE — Evaluation (Signed)
Physical Therapy Evaluation Patient Details Name: [name] MRN: [id number] DOB: 03-10-1934 Today's Date: 04/18/2015   History of Present Illness  81 yo male admitted with colitis. Hx of Alz dementia, COPD  Clinical Impression  On eval, pt required Min-Mod assist (+2 for safety) for mobility-walked ~115 feet with 1 HHA. Pt is unsteady. Pt followed cues/commands inconsistently. Increased assistance needed to initiate tasks. Per chart, daughter would like pt to return to ALF. Recommend HHPT and 24 hour supervision.     Follow Up Recommendations Home health PT;Supervision/Assistance - 24 hour (at ALF)    Equipment Recommendations  None recommended by PT    Recommendations for Other Services       Precautions / Restrictions Precautions Precautions: Fall Restrictions Weight Bearing Restrictions: No      Mobility  Bed Mobility Overal bed mobility: Needs Assistance Bed Mobility: Supine to Sit;Sit to Supine     Supine to sit: Mod assist Sit to supine: Mod assist   General bed mobility comments: Multimodal cueing required. Assist for trunk and LEs to initiate and complete task.   Transfers Overall transfer level: Needs assistance   Transfers: Sit to/from Stand Sit to Stand: Mod assist         General transfer comment: Assist to rise, stabilize, control descent. Assist to initiate and complete task.   Ambulation/Gait Ambulation/Gait assistance: Min assist;+2 safety/equipment Ambulation Distance (Feet): 115 Feet Assistive device: 2 person hand held assist Gait Pattern/deviations: Decreased stride length     General Gait Details: Assist to stabilize. Pt is unsteady.   Stairs            Wheelchair Mobility    Modified Rankin (Stroke Patients Only)       Balance Overall balance assessment: Needs assistance         Standing balance support: During functional activity Standing balance-Leahy Scale: Fair                                Pertinent Vitals/Pain Pain Assessment: No/denies pain    Home Living Family/patient expects to be discharged to:: Assisted living (memory care)     Type of Home: Assisted living (memory care)                Prior Function           Comments: pt unable to provide PLOF info     Hand Dominance        Extremity/Trunk Assessment   Upper Extremity Assessment: Overall WFL for tasks assessed           Lower Extremity Assessment: Generalized weakness      Cervical / Trunk Assessment: Normal  Communication   Communication: No difficulties  Cognition Arousal/Alertness: Awake/alert Behavior During Therapy: WFL for tasks assessed/performed Overall Cognitive Status: History of cognitive impairments - at baseline                      General Comments      Exercises        Assessment/Plan    PT Assessment Patient needs continued PT services  PT Diagnosis Difficulty walking   PT Problem List Decreased balance;Decreased mobility;Decreased cognition  PT Treatment Interventions Gait training;Functional mobility training;Therapeutic activities;Patient/family education;Therapeutic exercise;Balance training   PT Goals (Current goals can be found in the Care Plan section) Acute Rehab PT Goals Patient Stated Goal: none stated. daughter would like pt to return to ALF PT  Goal Formulation: Patient unable to participate in goal setting Time For Goal Achievement: 05/02/15 Potential to Achieve Goals: Fair    Frequency Min 3X/week   Barriers to discharge        Co-evaluation               End of Session Equipment Utilized During Treatment: Gait belt Activity Tolerance: Patient tolerated treatment well Patient left: in bed;with call bell/phone within reach;with bed alarm set           Time: [id number] PT Time Calculation (min) (ACUTE ONLY): 14 min   Charges:   PT Evaluation $Initial PT Evaluation Tier I: 1 Procedure     PT G Codes:         [name], MPT Pager: [phone]

## 2015-04-18 NOTE — Progress Notes (Signed)
Late in the day, patient became very confused and pulled his IV out. Paged MD. Per MD, okay to leave IV out. MD changed all IV meds to PO. Continue to monitor.

## 2015-04-18 NOTE — Progress Notes (Signed)
Initial Nutrition Assessment  DOCUMENTATION CODES:   Severe malnutrition in context of acute illness/injury  INTERVENTION:   -Continue Ensure Enlive po TID, each supplement provides 350 kcal and 20 grams of protein -Provide daily snack -Encourage PO intake -RD to continue to monitor  NUTRITION DIAGNOSIS:   Malnutrition related to acute illness as evidenced by percent weight loss, severe depletion of body fat, moderate depletions of muscle mass.  GOAL:   Patient will meet greater than or equal to 90% of their needs  MONITOR:   PO intake, Supplement acceptance, Labs, Weight trends, Skin, I & O's  REASON FOR ASSESSMENT:   Consult Assessment of nutrition requirement/status  ASSESSMENT:   81 year old male with past medical history of hypertension, dyslipidemia, depression who [facility] with bloody diarrhea for past 2 day prior to this admission.  Patient in room with family. Per family, pt has been eating less than usual but this has been normal for him for a while. Pt currently eating 85-100% of meals. For lunch, however, he ate ~75% of his roast beef and didn't eat any of his sides. Pt is drinking his Ensure supplements with no issues. RD to order daily snack of  Strawberry yogurt as well.  Per weight history, pt has lost 22 lb since 1/12 (16% weight loss x 10 months, significant for time frame).  Nutrition-Focused physical exam completed. Findings are severe fat depletion, moderate muscle depletion, and no edema.   Labs reviewed: Elevated BUN & Creatinine  Diet Order:  Diet regular Room service appropriate?: Yes; Fluid consistency:: Thin  Skin:  Reviewed, no issues  Last BM:  11/4  Height:   Ht Readings from Last 1 Encounters:  04/16/15 5\' 7"  (1.702 m)    Weight:   Wt Readings from Last 1 Encounters:  04/16/15 113 lb 14.4 oz (51.665 kg)    Ideal Body Weight:  61.4 kg  BMI:  Body mass index is 17.84 kg/(m^2).  Estimated Nutritional Needs:    Kcal:  1500-1700  Protein:  70-80g  Fluid:  1.7L/day  EDUCATION NEEDS:   No education needs identified at this time  Tilda FrancoLindsey Jahmad Petrich, MS, RD, LDN Pager: [phone] After Hours Pager: [phone]

## 2015-04-18 NOTE — Progress Notes (Signed)
[facility] PROGRESS NOTE  Vern ClaudeRalph A Wohlers QMV:[id number] DOB: 1934-04-04 DOA: 04/15/2015 PCP: Florentina JennyRIPP, HENRY, MD  Brief narrative:    81 year old male with past medical history of hypertension, dyslipidemia, depression who [facility] diarrhea for past 2 day prior to this admission.  Anticipated discharge: Likely 11/8 to SNF.  Assessment/Plan:    Principal Problem:   Colitis presumed infectious / Proctitis - Continue cipro and flagyl for now - C.diff and GI pathogen panel pending  - Has had 1 BM in past  24-48 hours  - Diet as tolerated   Active Problems:   Essential hypertension - BP controlled without BP meds    Acute renal failure - Likely from Lotensin which was placed on hold - Cr improving with IV fluids, 1.8 --> 1.37    Acute encephalopathy / Depression - Using haldol PRN for agitation, restlessness  - Stable at this time - Sitter at the bedside - Continue Seroquel     Dyslipidemia - Continue Pravachol    DVT Prophylaxis  - SCD's bilaterally in hospital   Code Status: Full.  Family Communication:  plan of care discussed with the patient, no family at the bedside  Disposition Plan: to SNF likely 11/8.  IV access:  Peripheral IV  Procedures and diagnostic studies:    Ct Abdomen Pelvis Wo Contrast 04/15/2015 1. Proctitis and distal sigmoid colitis. This is most likely infectious or inflammatory in nature. 2. Small bilateral inguinal hernias containing fat, larger on the left. Electronically Signed   By: Beckie SaltsSteven  Reid M.D.   On: 04/15/2015 23:32    Medical Consultants:  None   Other Consultants:  PT Nutrition  IAnti-Infectives:   Cipro 04/16/2015 --> Flagyl 04/16/2015 -->   Manson PasseyEVINE, Jatia Musa, MD  [facility] Pager [phone]  Time spent in minutes: 25 minutes  If 7PM-7AM, please contact night-coverage [email or web address] Password [email or web address] 04/18/2015, 11:06 AM   LOS: 3 days    HPI/Subjective: Pt much better this am. Stable, not  agitated.   Objective: Filed Vitals:   04/17/15 0610 04/17/15 1317 04/17/15 2139 04/18/15 0647  BP: 117/60 125/50 154/76 117/70  Pulse: 86 95 108 87  Temp: 98 F (36.7 C) 97.3 F (36.3 C) 97.8 F (36.6 C) 98.3 F (36.8 C)  TempSrc: Oral Oral Oral Oral  Resp: 20 20 18 18   Height:      Weight:      SpO2: 95% 100% 98% 94%    Intake/Output Summary (Last 24 hours) at 04/18/15 1106 Last data filed at 04/18/15 0840  Gross per 24 hour  Intake    720 ml  Output    700 ml  Net     20 ml    Exam:   General:  Pt is alert, not agitated   Cardiovascular: RRR, S1/S2 (+)  Respiratory: No wheezing, no crackles, no rhonchi  Abdomen: (+) BS, non tender abdomen   Extremities: No leg swelling, pulses palpable bilaterally  Neuro: Nonfocal  Data Reviewed: Basic Metabolic Panel:  Recent Labs Lab 04/15/15 2101 04/15/15 2203 04/16/15 0515  NA 138 140 138  K 4.3 4.8 3.9  CL 102 100* 108  CO2 29  --  23  GLUCOSE 133* 135* 109*  BUN 45* 57* 32*  CREATININE 2.02* 1.80* 1.37*  CALCIUM 9.0  --  8.5*   Liver Function Tests:  Recent Labs Lab 04/15/15 2101  AST 22  ALT 13*  ALKPHOS 51  BILITOT 0.7  PROT 6.8  ALBUMIN 3.8  No results for input(s): LIPASE, AMYLASE in the last 168 hours. No results for input(s): AMMONIA in the last 168 hours. CBC:  Recent Labs Lab 04/15/15 2101 04/15/15 2203 04/16/15 0515  WBC 8.5  --  6.4  NEUTROABS 7.3  --   --   HGB 11.2* 10.9* 10.4*  HCT 33.5* 32.0* 31.7*  MCV 94.4  --  92.7  PLT 199  --  173   Cardiac Enzymes: No results for input(s): CKTOTAL, CKMB, CKMBINDEX, TROPONINI in the last 168 hours. BNP: Invalid input(s): POCBNP CBG: No results for input(s): GLUCAP in the last 168 hours.  Recent Results (from the past 240 hour(s))  Blood culture (routine x 2)     Status: None (Preliminary result)   Collection Time: 04/15/15  9:06 PM  Result Value Ref Range Status   Specimen Description BLOOD RIGHT ARM  Final   Special  Requests BOTTLES DRAWN AEROBIC AND ANAEROBIC 5CC  Final   Culture   Final    NO GROWTH 1 DAY Performed at [facility]    Report Status PENDING  Incomplete  Blood culture (routine x 2)     Status: None (Preliminary result)   Collection Time: 04/15/15 10:00 PM  Result Value Ref Range Status   Specimen Description BLOOD LEFT HAND  Final   Special Requests BOTTLES DRAWN AEROBIC AND ANAEROBIC 5 ML  Final   Culture   Final    NO GROWTH 1 DAY Performed at [facility]    Report Status PENDING  Incomplete  Urine culture     Status: None   Collection Time: 04/16/15 12:55 AM  Result Value Ref Range Status   Specimen Description URINE, RANDOM  Final   Special Requests NONE  Final   Culture   Final    NO GROWTH 1 DAY Performed at [facility]    Report Status 04/17/2015 FINAL  Final     Scheduled Meds: . ciprofloxacin  400 mg Intravenous Q12H  . divalproex  500 mg Oral Daily  . feeding supplement (ENSURE ENLIVE)  237 mL Oral TID BM  . latanoprost  1 drop Both Eyes QHS  . loratadine  10 mg Oral q morning - 10a  . metronidazole  500 mg Intravenous Q8H  . mirtazapine  30 mg Oral QHS  . multivitamin with minerals  1 tablet Oral q morning - 10a  . polyvinyl alcohol  1 drop Both Eyes BID  . pravastatin  10 mg Oral QHS  . QUEtiapine  100 mg Oral QHS  . QUEtiapine  50 mg Oral Daily   Continuous Infusions: . sodium chloride 125 mL/hr at 04/17/15 1045

## 2015-04-18 NOTE — Progress Notes (Signed)
[facility] to inform of patient's anticipated D/C date but received no answer. CSW left voicemail with Admissions Representative Zane HeraldDeborah Warren at 1:03PM. CSW will continue to follow up with facility.   Fernande BoydenJoyce Rockelle Heuerman, [facility] HospitalCSWA Clinical Social Worker [facility] [phone]

## 2015-04-19 DIAGNOSIS — F329 Major depressive disorder, single episode, unspecified: Secondary | ICD-10-CM

## 2015-04-19 MED ORDER — CIPROFLOXACIN HCL 500 MG PO TABS: 500.0000 mg | ORAL_TABLET | Freq: Two times a day (BID) | ORAL | Status: DC

## 2015-04-19 MED ORDER — METRONIDAZOLE 500 MG PO TABS: 500.0000 mg | ORAL_TABLET | Freq: Three times a day (TID) | ORAL | Status: DC

## 2015-04-19 MED ORDER — LORAZEPAM 2 MG/ML IJ SOLN
0.5000 mg | Freq: Once | INTRAMUSCULAR | Status: AC
  Administered 2015-04-19: 0.5 mg via INTRAMUSCULAR
  Filled 2015-04-19: qty 1

## 2015-04-19 MED ORDER — HALOPERIDOL 2 MG PO TABS: 2.0000 mg | ORAL_TABLET | Freq: Two times a day (BID) | ORAL | Status: AC

## 2015-04-19 NOTE — Discharge Summary (Signed)
Physician Discharge Summary  [name] ZOX:[id number] DOB: 13-Dec-1933 DOA: 04/15/2015  PCP: Florentina JennyRIPP, HENRY, MD  Admit date: 04/15/2015 Discharge date: 04/19/2015  Recommendations for Outpatient Follow-up:  1. Continue cipro and flagyl on discharge for 7 more days   Discharge Diagnoses:  Principal Problem:   Colitis presumed infectious Active Problems:   Essential hypertension   Diarrhea   AKI (acute kidney injury) (HCC)    Discharge Condition: stable   Diet recommendation: as tolerated   History of present illness:  81 year old male with past medical history of hypertension, dyslipidemia, depression who [facility] with bloody diarrhea for past 2 day prior to this admission. He was found to have colitis on this admission and was started on cipro and flagyl.  Hospital Course:   Assessment/Plan:    Principal Problem:  Colitis presumed infectious / Proctitis - Continue cipro and flagyl for now - C.diff negative - Continue cipro and flagyl for 7 days on discharge for tx of colitis/proctitis  - Has had 1 BM in past 24-48 hours  - Diet as tolerated   Active Problems:  Essential hypertension - BP controlled without BP meds   Acute renal failure - Likely from Lotensin which was placed on hold - Cr improved with fluids, 1.8 --> 1.37   Acute encephalopathy / Depression - Using haldol PRN for agitation, restlessness  - Stable at this time - Continue Seroquel    Dyslipidemia - Continue Pravachol    DVT Prophylaxis  - SCD's bilaterally  Code Status: Full.  Family Communication: plan of care discussed with the patient, no family at the bedside    IV access:  Peripheral IV  Procedures and diagnostic studies:   Ct Abdomen Pelvis Wo Contrast 04/15/2015 1. Proctitis and distal sigmoid colitis. This is most likely infectious or inflammatory in nature. 2. Small bilateral inguinal hernias containing fat, larger on the left. Electronically  Signed By: Beckie SaltsSteven Reid M.D. On: 04/15/2015 23:32    Medical Consultants:  None   Other Consultants:  PT Nutrition  IAnti-Infectives:   Cipro 04/16/2015 --> for 7 days on discharge Flagyl 04/16/2015 --> for 7 days on discharge  Signed:  Manson PasseyEVINE, Shukri Nistler, MD  [facility] 04/19/2015, 11:00 AM  Pager #: [phone]  Time spent in minutes: more than 30 minutes  Discharge Exam: Filed Vitals:   04/19/15 0644  BP: 123/68  Pulse: 93  Temp: 97.9 F (36.6 C)  Resp: 18   Filed Vitals:   04/18/15 0647 04/18/15 1535 04/18/15 2101 04/19/15 0644  BP: 117/70 131/68 122/52 123/68  Pulse: 87 91 97 93  Temp: 98.3 F (36.8 C) 97.7 F (36.5 C) 97.8 F (36.6 C) 97.9 F (36.6 C)  TempSrc: Oral Oral Oral Axillary  Resp: 18 18 18 18   Height:      Weight:      SpO2: 94% 99% 100% 98%    General: Pt is alert, not in acute distress Cardiovascular: Regular rate and rhythm, S1/S2 + Respiratory: Clear to auscultation bilaterally, no wheezing, no crackles, no rhonchi Abdominal: Soft, non tender, non distended, bowel sounds +, no guarding Extremities: no edema, no cyanosis, pulses palpable bilaterally DP and PT Neuro: Grossly nonfocal  Discharge Instructions  Discharge Instructions    Call MD for:  difficulty breathing, headache or visual disturbances    Complete by:  As directed      Call MD for:  persistant dizziness or light-headedness    Complete by:  As directed  Call MD for:  persistant nausea and vomiting    Complete by:  As directed      Call MD for:  severe uncontrolled pain    Complete by:  As directed      Diet - low sodium heart healthy    Complete by:  As directed      Increase activity slowly    Complete by:  As directed             Medication List    STOP taking these medications        benazepril-hydrochlorthiazide 20-12.5 MG tablet  Commonly known as:  LOTENSIN HCT     LORazepam 0.5 MG tablet  Commonly known as:  ATIVAN      TAKE  these medications        acetaminophen 500 MG tablet  Commonly known as:  TYLENOL  Take 500 mg by mouth 2 (two) times daily as needed for moderate pain.     aspirin 81 MG chewable tablet  Chew 81 mg by mouth every morning.     ciprofloxacin 500 MG tablet  Commonly known as:  CIPRO  Take 1 tablet (500 mg total) by mouth 2 (two) times daily.     divalproex 500 MG 24 hr tablet  Commonly known as:  DEPAKOTE ER  Take 500 mg by mouth daily.     ENSURE  Take 237 mLs by mouth 3 (three) times daily between meals.     haloperidol 2 MG tablet  Commonly known as:  HALDOL  Take 1 tablet (2 mg total) by mouth 2 (two) times daily.     latanoprost 0.005 % ophthalmic solution  Commonly known as:  XALATAN  Place 1 drop into both eyes at bedtime.     loratadine 10 MG tablet  Commonly known as:  CLARITIN  Take 10 mg by mouth every morning.     metroNIDAZOLE 500 MG tablet  Commonly known as:  FLAGYL  Take 1 tablet (500 mg total) by mouth every 8 (eight) hours.     mirtazapine 30 MG tablet  Commonly known as:  REMERON  Take 30 mg by mouth at bedtime.     multivitamin with minerals Tabs tablet  Take 1 tablet by mouth every morning.     pravastatin 10 MG tablet  Commonly known as:  PRAVACHOL  Take 10 mg by mouth at bedtime.     QUEtiapine 50 MG tablet  Commonly known as:  SEROQUEL  Take 50-100 mg by mouth 2 (two) times daily. 50mg  in the morning and 100 mg at bedtime     VISINE PURE TEARS 0.2-0.2-1 % Soln  Generic drug:  Glycerin-Hypromellose-PEG 400  Place 1 drop into both eyes 2 (two) times daily.           Follow-up Information    Follow up with Florentina Jenny, MD. Schedule an appointment as soon as possible for a visit in 1 week.   Specialty:  Family Medicine   Why:  Follow up appt after recent hospitalization   Contact information:   [address] [place] [address] [phone]        The results of significant diagnostics from this  hospitalization (including imaging, microbiology, ancillary and laboratory) are listed below for reference.    Significant Diagnostic Studies: Ct Abdomen Pelvis Wo Contrast  04/15/2015  CLINICAL DATA:  Rectal bleeding.  Blood in the stool.  Fever. EXAM: CT ABDOMEN AND PELVIS WITHOUT CONTRAST TECHNIQUE: Multidetector CT  imaging of the abdomen and pelvis was performed following the standard protocol without IV contrast. COMPARISON:  Previous examinations, the most recent dated 08/22/2005. FINDINGS: Lower chest:  Interval mild atelectasis/scarring at both lung bases. Hepatobiliary: Small calcified granuloma in the liver. Poorly distended, normal appearing gallbladder. Pancreas: Diffuse atrophy.  No focal abnormality. Spleen: Within normal limits in size. Adrenals/Urinary Tract: No evidence of urolithiasis or hydronephrosis. No definite mass visualized on this un-enhanced exam. Stomach/Bowel: Diffuse low-density wall thickening involving the rectum and distal sigmoid colon. Normal appearing appendix. No gastric or small bowel abnormalities. Vascular/Lymphatic: Status post resection of the previously seen distal abdominal aortic aneurysm with an aorto bi-iliac graft. Atheromatous arterial calcifications. No enlarged lymph nodes. Reproductive: Normal sized prostate gland. Other: Small bilateral inguinal hernias containing fat, larger on the left. Anterior abdominal wall hernia repair mesh anchors. Musculoskeletal: Mild scoliosis. Lumbar and lower thoracic spine degenerative changes. IMPRESSION: 1. Proctitis and distal sigmoid colitis. This is most likely infectious or inflammatory in nature. 2. Small bilateral inguinal hernias containing fat, larger on the left. Electronically Signed   By: Beckie Salts M.D.   On: 04/15/2015 23:32    Microbiology: Recent Results (from the past 240 hour(s))  Blood culture (routine x 2)     Status: None (Preliminary result)   Collection Time: 04/15/15  9:06 PM  Result Value Ref  Range Status   Specimen Description BLOOD RIGHT ARM  Final   Special Requests BOTTLES DRAWN AEROBIC AND ANAEROBIC 5CC  Final   Culture   Final    NO GROWTH 2 DAYS Performed at [facility]    Report Status PENDING  Incomplete  Blood culture (routine x 2)     Status: None (Preliminary result)   Collection Time: 04/15/15 10:00 PM  Result Value Ref Range Status   Specimen Description BLOOD LEFT HAND  Final   Special Requests BOTTLES DRAWN AEROBIC AND ANAEROBIC 5 ML  Final   Culture   Final    NO GROWTH 2 DAYS Performed at [facility]    Report Status PENDING  Incomplete  Urine culture     Status: None   Collection Time: 04/16/15 12:55 AM  Result Value Ref Range Status   Specimen Description URINE, RANDOM  Final   Special Requests NONE  Final   Culture   Final    NO GROWTH 1 DAY Performed at [facility]    Report Status 04/17/2015 FINAL  Final  C difficile quick scan w PCR reflex     Status: None   Collection Time: 04/18/15  9:40 AM  Result Value Ref Range Status   C Diff antigen NEGATIVE NEGATIVE Final   C Diff toxin NEGATIVE NEGATIVE Final   C Diff interpretation Negative for toxigenic C. difficile  Final     Labs: Basic Metabolic Panel:  Recent Labs Lab 04/15/15 2101 04/15/15 2203 04/16/15 0515  NA 138 140 138  K 4.3 4.8 3.9  CL 102 100* 108  CO2 29  --  23  GLUCOSE 133* 135* 109*  BUN 45* 57* 32*  CREATININE 2.02* 1.80* 1.37*  CALCIUM 9.0  --  8.5*   Liver Function Tests:  Recent Labs Lab 04/15/15 2101  AST 22  ALT 13*  ALKPHOS 51  BILITOT 0.7  PROT 6.8  ALBUMIN 3.8   No results for input(s): LIPASE, AMYLASE in the last 168 hours. No results for input(s): AMMONIA in the last 168 hours. CBC:  Recent Labs Lab 04/15/15 2101 04/15/15 2203  04/16/15 0515  WBC 8.5  --  6.4  NEUTROABS 7.3  --   --   HGB 11.2* 10.9* 10.4*  HCT 33.5* 32.0* 31.7*  MCV 94.4  --  92.7  PLT 199  --  173   Cardiac Enzymes: No results for  input(s): CKTOTAL, CKMB, CKMBINDEX, TROPONINI in the last 168 hours. BNP: BNP (last 3 results) No results for input(s): BNP in the last 8760 hours.  ProBNP (last 3 results) No results for input(s): PROBNP in the last 8760 hours.  CBG: No results for input(s): GLUCAP in the last 168 hours.
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
## 2015-04-19 NOTE — Discharge Instructions (Signed)
°  Colitis °Colitis is inflammation of the colon. Colitis may last a short time (acute) or it may last a long time (chronic). °CAUSES °This condition may be caused by: °· Viruses. °· Bacteria. °· Reactions to medicine. °· Certain autoimmune diseases, such as Crohn disease or ulcerative colitis. °SYMPTOMS °Symptoms of this condition include: °· Diarrhea. °· Passing bloody or tarry stool. °· Pain. °· Fever. °· Vomiting. °· Tiredness (fatigue). °· Weight loss. °· Bloating. °· Sudden increase in abdominal pain. °· Having fewer bowel movements than usual. °DIAGNOSIS °This condition is diagnosed with a stool test or a blood test. You may also have other tests, including X-rays, a CT scan, or a colonoscopy. °TREATMENT °Treatment may include: °· Resting the bowel. This involves not eating or drinking for a period of time. °· Fluids that are given through an IV tube. °· Medicine for pain and diarrhea. °· Antibiotic medicines. °· Cortisone medicines. °· Surgery. °HOME CARE INSTRUCTIONS °Eating and Drinking °· Follow instructions from your health care provider about eating or drinking restrictions. °· Drink enough fluid to keep your urine clear or pale yellow. °· Work with a dietitian to determine which foods cause your condition to flare up. °· Avoid foods that cause flare-ups. °· Eat a well-balanced diet. °Medicines °· Take over-the-counter and prescription medicines only as told by your health care provider. °· If you were prescribed an antibiotic medicine, take it as told by your health care provider. Do not stop taking the antibiotic even if you start to feel better. °General Instructions °· Keep all follow-up visits as told by your health care provider. This is important. °SEEK MEDICAL CARE IF: °· Your symptoms do not go away. °· You develop new symptoms. °SEEK IMMEDIATE MEDICAL CARE IF: °· You have a fever that does not go away with treatment. °· You develop chills. °· You have extreme weakness, fainting, or  dehydration. °· You have repeated vomiting. °· You develop severe pain in your abdomen. °· You pass bloody or tarry stool. °  °This information is not intended to replace advice given to you by your health care provider. Make sure you discuss any questions you have with your health care provider. °  °Document Released: 07/05/2004 Document Revised: 02/16/2015 Document Reviewed: 09/20/2014 °Elsevier Interactive Patient Education ©2016 [employer]vier Inc. ° °

## 2015-04-19 NOTE — Progress Notes (Signed)
CSW [facility] Facility is willing to take patient on today. Facility requested clinical information. CSW faxed over clinicals for review. Per Natalia LeatherwoodKatherine, she will follow up with CSW once clinicals are reviewed. Per Natalia LeatherwoodKatherine, she will also follow up regarding time and whether a staff member will be available to transport patient.   Fernande BoydenJoyce Jermaine Neuharth, LCSWA Clinical Social Worker [facility] Ph: [phone]

## 2015-04-20 LAB — GI PATHOGEN PANEL BY PCR, STOOL
C DIFFICILE TOXIN A/B: NOT DETECTED
CAMPYLOBACTER BY PCR: NOT DETECTED
Cryptosporidium by PCR: NOT DETECTED
E coli (ETEC) LT/ST: NOT DETECTED
E coli (STEC): NOT DETECTED
E coli 0157 by PCR: NOT DETECTED
G LAMBLIA BY PCR: NOT DETECTED
Norovirus GI/GII: NOT DETECTED
Rotavirus A by PCR: NOT DETECTED
SALMONELLA BY PCR: NOT DETECTED
SHIGELLA BY PCR: NOT DETECTED

## 2015-04-20 NOTE — Progress Notes (Signed)
Pt [facility]. VSS. Pt belongings sent with pt. Discharge papers were sent. [facility] but no one at the facility answered the phone calls. Charliee Krenz W Shelsea Hangartner, RN

## 2015-04-20 NOTE — Progress Notes (Signed)
CSW contacted Director [name] this morning to confirm time for patient to arrive. Per Riley Poundeborah, they are ready for the patient now. CSW to inform RN and arrange transportation via [facility]. CSW will also contact family regarding transfer. No further CSW needs requested at this time. CSW to sign off.   Please consult if further CSW needs arise.   Riley BoydenJoyce Nealy Day, [facility] Clinical Social Worker [facility] [phone]

## 2015-04-20 NOTE — Progress Notes (Signed)
Pt seen and examined at the bedside Please refer to discharge summary done 11/8. Pt medically stable for discharge depending on final decision abotu acceptance to SNF. No changes in medical management since 11/8.  [name] [phone]

## 2015-04-21 LAB — CULTURE, BLOOD (ROUTINE X 2)
CULTURE: NO GROWTH
CULTURE: NO GROWTH

## 2015-09-14 ENCOUNTER — Encounter (HOSPITAL_COMMUNITY): Payer: Self-pay | Admitting: Emergency Medicine

## 2015-09-14 ENCOUNTER — Emergency Department (HOSPITAL_COMMUNITY)
Admission: EM | Admit: 2015-09-14 | Discharge: 2015-09-14 | Disposition: A | Discharge status: Home / Self Care | Payer: Medicare Other | Attending: Emergency Medicine | Admitting: Emergency Medicine

## 2015-09-14 ENCOUNTER — Emergency Department (HOSPITAL_COMMUNITY): Payer: Medicare Other

## 2015-09-14 DIAGNOSIS — G309 Alzheimer's disease, unspecified: Secondary | ICD-10-CM | POA: Diagnosis not present

## 2015-09-14 DIAGNOSIS — I10 Essential (primary) hypertension: Secondary | ICD-10-CM | POA: Insufficient documentation

## 2015-09-14 DIAGNOSIS — Y9389 Activity, other specified: Secondary | ICD-10-CM | POA: Diagnosis not present

## 2015-09-14 DIAGNOSIS — S3991XA Unspecified injury of abdomen, initial encounter: Secondary | ICD-10-CM | POA: Insufficient documentation

## 2015-09-14 DIAGNOSIS — F0280 Dementia in other diseases classified elsewhere without behavioral disturbance: Secondary | ICD-10-CM | POA: Diagnosis not present

## 2015-09-14 DIAGNOSIS — Y998 Other external cause status: Secondary | ICD-10-CM | POA: Diagnosis not present

## 2015-09-14 DIAGNOSIS — J449 Chronic obstructive pulmonary disease, unspecified: Secondary | ICD-10-CM | POA: Insufficient documentation

## 2015-09-14 DIAGNOSIS — F329 Major depressive disorder, single episode, unspecified: Secondary | ICD-10-CM | POA: Insufficient documentation

## 2015-09-14 DIAGNOSIS — E785 Hyperlipidemia, unspecified: Secondary | ICD-10-CM | POA: Insufficient documentation

## 2015-09-14 DIAGNOSIS — Z7982 Long term (current) use of aspirin: Secondary | ICD-10-CM | POA: Diagnosis not present

## 2015-09-14 DIAGNOSIS — Y9289 Other specified places as the place of occurrence of the external cause: Secondary | ICD-10-CM | POA: Diagnosis not present

## 2015-09-14 DIAGNOSIS — S3992XA Unspecified injury of lower back, initial encounter: Secondary | ICD-10-CM | POA: Insufficient documentation

## 2015-09-14 DIAGNOSIS — Z87891 Personal history of nicotine dependence: Secondary | ICD-10-CM | POA: Insufficient documentation

## 2015-09-14 DIAGNOSIS — Z8719 Personal history of other diseases of the digestive system: Secondary | ICD-10-CM | POA: Diagnosis not present

## 2015-09-14 DIAGNOSIS — Z79899 Other long term (current) drug therapy: Secondary | ICD-10-CM | POA: Diagnosis not present

## 2015-09-14 LAB — CBC WITH DIFFERENTIAL/PLATELET
BASOS ABS: 0 10*3/uL (ref 0.0–0.1)
BASOS PCT: 1 %
EOS ABS: 0.2 10*3/uL (ref 0.0–0.7)
EOS PCT: 3 %
HCT: 36.4 % — ABNORMAL LOW (ref 39.0–52.0)
Hemoglobin: 12.4 g/dL — ABNORMAL LOW (ref 13.0–17.0)
Lymphocytes Relative: 14 %
Lymphs Abs: 0.9 10*3/uL (ref 0.7–4.0)
MCH: 31 pg (ref 26.0–34.0)
MCHC: 34.1 g/dL (ref 30.0–36.0)
MCV: 91 fL (ref 78.0–100.0)
MONO ABS: 0.7 10*3/uL (ref 0.1–1.0)
Monocytes Relative: 11 %
Neutro Abs: 4.4 10*3/uL (ref 1.7–7.7)
Neutrophils Relative %: 71 %
PLATELETS: 220 10*3/uL (ref 150–400)
RBC: 4 MIL/uL — ABNORMAL LOW (ref 4.22–5.81)
RDW: 13.6 % (ref 11.5–15.5)
WBC: 6.1 10*3/uL (ref 4.0–10.5)

## 2015-09-14 LAB — COMPREHENSIVE METABOLIC PANEL
ALBUMIN: 3.7 g/dL (ref 3.5–5.0)
ALT: 15 U/L — ABNORMAL LOW (ref 17–63)
AST: 21 U/L (ref 15–41)
Alkaline Phosphatase: 80 U/L (ref 38–126)
Anion gap: 6 (ref 5–15)
BUN: 27 mg/dL — AB (ref 6–20)
CHLORIDE: 105 mmol/L (ref 101–111)
CO2: 28 mmol/L (ref 22–32)
Calcium: 9.2 mg/dL (ref 8.9–10.3)
Creatinine, Ser: 1.04 mg/dL (ref 0.61–1.24)
GFR calc Af Amer: >60 mL/min (ref >60)
Glucose, Bld: 116 mg/dL — ABNORMAL HIGH (ref 65–99)
POTASSIUM: 4.2 mmol/L (ref 3.5–5.1)
Sodium: 139 mmol/L (ref 135–145)
Total Bilirubin: 0.2 mg/dL — ABNORMAL LOW (ref 0.3–1.2)
Total Protein: 7 g/dL (ref 6.5–8.1)

## 2015-09-14 LAB — I-STAT CHEM 8, ED
BUN: 31 mg/dL — AB (ref 6–20)
CALCIUM ION: 1.11 mmol/L — AB (ref 1.13–1.30)
CHLORIDE: 101 mmol/L (ref 101–111)
Creatinine, Ser: 1 mg/dL (ref 0.61–1.24)
Glucose, Bld: 112 mg/dL — ABNORMAL HIGH (ref 65–99)
HCT: 38 % — ABNORMAL LOW (ref 39.0–52.0)
Hemoglobin: 12.9 g/dL — ABNORMAL LOW (ref 13.0–17.0)
Potassium: 4.2 mmol/L (ref 3.5–5.1)
SODIUM: 141 mmol/L (ref 135–145)
TCO2: 29 mmol/L (ref 0–100)

## 2015-09-14 MED ORDER — IOPAMIDOL (ISOVUE-370) INJECTION 76%
100.0000 mL | Freq: Once | INTRAVENOUS | Status: AC | PRN
  Administered 2015-09-14: 100 mL via INTRAVENOUS

## 2015-09-14 MED ORDER — IBUPROFEN 200 MG PO TABS
400.0000 mg | ORAL_TABLET | Freq: Once | ORAL | Status: AC
  Administered 2015-09-14: 400 mg via ORAL
  Filled 2015-09-14: qty 2

## 2015-09-14 MED ORDER — SODIUM CHLORIDE 0.9 % IV BOLUS (SEPSIS)
1000.0000 mL | Freq: Once | INTRAVENOUS | Status: AC
  Administered 2015-09-14: 1000 mL via INTRAVENOUS

## 2015-09-14 NOTE — ED Notes (Signed)
Called [facility] and talked to [place].

## 2015-09-14 NOTE — ED Notes (Signed)
Pt ambulated to bathroom 

## 2015-09-14 NOTE — ED Notes (Signed)
Patient ambulate without any assistance. Patient did not have any problems.
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
## 2015-09-14 NOTE — ED Notes (Signed)
Patient cleaned up and diaper applied.

## 2015-09-14 NOTE — ED Notes (Signed)
Patient discharged and waiting on [facility].

## 2015-09-21 ENCOUNTER — Encounter (HOSPITAL_COMMUNITY): Payer: Self-pay | Admitting: *Deleted

## 2015-09-21 ENCOUNTER — Emergency Department (HOSPITAL_COMMUNITY)
Admission: EM | Admit: 2015-09-21 | Discharge: 2015-09-21 | Disposition: A | Discharge status: Home / Self Care | Payer: Medicare Other | Attending: Emergency Medicine | Admitting: Emergency Medicine

## 2015-09-21 ENCOUNTER — Emergency Department (HOSPITAL_COMMUNITY): Payer: Medicare Other

## 2015-09-21 DIAGNOSIS — J449 Chronic obstructive pulmonary disease, unspecified: Secondary | ICD-10-CM | POA: Insufficient documentation

## 2015-09-21 DIAGNOSIS — T148XXA Other injury of unspecified body region, initial encounter: Secondary | ICD-10-CM

## 2015-09-21 DIAGNOSIS — Y998 Other external cause status: Secondary | ICD-10-CM | POA: Insufficient documentation

## 2015-09-21 DIAGNOSIS — Y9389 Activity, other specified: Secondary | ICD-10-CM | POA: Diagnosis not present

## 2015-09-21 DIAGNOSIS — Z8719 Personal history of other diseases of the digestive system: Secondary | ICD-10-CM | POA: Diagnosis not present

## 2015-09-21 DIAGNOSIS — F329 Major depressive disorder, single episode, unspecified: Secondary | ICD-10-CM | POA: Insufficient documentation

## 2015-09-21 DIAGNOSIS — S7002XA Contusion of left hip, initial encounter: Secondary | ICD-10-CM | POA: Diagnosis not present

## 2015-09-21 DIAGNOSIS — Z7982 Long term (current) use of aspirin: Secondary | ICD-10-CM | POA: Insufficient documentation

## 2015-09-21 DIAGNOSIS — Y9289 Other specified places as the place of occurrence of the external cause: Secondary | ICD-10-CM | POA: Insufficient documentation

## 2015-09-21 DIAGNOSIS — G309 Alzheimer's disease, unspecified: Secondary | ICD-10-CM | POA: Diagnosis not present

## 2015-09-21 DIAGNOSIS — I10 Essential (primary) hypertension: Secondary | ICD-10-CM | POA: Diagnosis not present

## 2015-09-21 DIAGNOSIS — Z87891 Personal history of nicotine dependence: Secondary | ICD-10-CM | POA: Diagnosis not present

## 2015-09-21 DIAGNOSIS — S79912A Unspecified injury of left hip, initial encounter: Secondary | ICD-10-CM | POA: Diagnosis present

## 2015-09-21 DIAGNOSIS — M25552 Pain in left hip: Secondary | ICD-10-CM

## 2015-09-21 DIAGNOSIS — E785 Hyperlipidemia, unspecified: Secondary | ICD-10-CM | POA: Insufficient documentation

## 2015-09-21 DIAGNOSIS — Z79899 Other long term (current) drug therapy: Secondary | ICD-10-CM | POA: Diagnosis not present

## 2015-09-21 DIAGNOSIS — F0280 Dementia in other diseases classified elsewhere without behavioral disturbance: Secondary | ICD-10-CM | POA: Diagnosis not present
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
## 2015-09-21 NOTE — ED Notes (Signed)
[facility] HERE FOR TRANSPORT. BLANKET TRANSPORTED BACK WITH [facility].

## 2015-09-21 NOTE — ED Notes (Signed)
[facility] called for transport back to [facility]
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
## 2015-12-17 ENCOUNTER — Encounter (HOSPITAL_COMMUNITY): Payer: Self-pay

## 2015-12-17 ENCOUNTER — Emergency Department (HOSPITAL_COMMUNITY)
Admission: EM | Admit: 2015-12-17 | Discharge: 2015-12-17 | Disposition: A | Discharge status: Home / Self Care | Payer: Medicare Other | Attending: Emergency Medicine | Admitting: Emergency Medicine

## 2015-12-17 DIAGNOSIS — Y92129 Unspecified place in nursing home as the place of occurrence of the external cause: Secondary | ICD-10-CM | POA: Diagnosis not present

## 2015-12-17 DIAGNOSIS — Z7982 Long term (current) use of aspirin: Secondary | ICD-10-CM | POA: Diagnosis not present

## 2015-12-17 DIAGNOSIS — F329 Major depressive disorder, single episode, unspecified: Secondary | ICD-10-CM | POA: Diagnosis not present

## 2015-12-17 DIAGNOSIS — Z048 Encounter for examination and observation for other specified reasons: Secondary | ICD-10-CM | POA: Diagnosis present

## 2015-12-17 DIAGNOSIS — Z87891 Personal history of nicotine dependence: Secondary | ICD-10-CM | POA: Diagnosis not present

## 2015-12-17 DIAGNOSIS — W19XXXA Unspecified fall, initial encounter: Secondary | ICD-10-CM | POA: Diagnosis not present

## 2015-12-17 DIAGNOSIS — I10 Essential (primary) hypertension: Secondary | ICD-10-CM | POA: Insufficient documentation

## 2015-12-17 DIAGNOSIS — J449 Chronic obstructive pulmonary disease, unspecified: Secondary | ICD-10-CM | POA: Diagnosis not present

## 2015-12-17 DIAGNOSIS — Y999 Unspecified external cause status: Secondary | ICD-10-CM | POA: Diagnosis not present

## 2015-12-17 DIAGNOSIS — Z79899 Other long term (current) drug therapy: Secondary | ICD-10-CM | POA: Diagnosis not present

## 2015-12-17 DIAGNOSIS — Y939 Activity, unspecified: Secondary | ICD-10-CM | POA: Diagnosis not present

## 2015-12-17 DIAGNOSIS — Z8659 Personal history of other mental and behavioral disorders: Secondary | ICD-10-CM | POA: Insufficient documentation

## 2015-12-17 DIAGNOSIS — G309 Alzheimer's disease, unspecified: Secondary | ICD-10-CM | POA: Diagnosis not present

## 2015-12-17 NOTE — ED Notes (Signed)
[facility]

## 2015-12-17 NOTE — Discharge Instructions (Signed)

## 2015-12-17 NOTE — ED Notes (Signed)
Bed: [id number] Expected date:  Expected time:  Means of arrival:  Comments: EMS 82 yo M fall

## 2015-12-17 NOTE — ED Provider Notes (Signed)
CSN: [id number]     Arrival date & time 12/17/15  2247 History   First MD Initiated Contact with Patient 12/17/15 2300     Chief Complaint  Patient presents with  . Fall     (Consider location/radiation/quality/duration/timing/severity/associated sxs/prior Treatment) HPI Comments: 82 year old male presents after having an unwitnessed fall at the nursing home. Reportedly was seen 5 minutes prior to the event. Was found on the ground. Has history of dementia but no obvious injuries noted. He denies any complaints at this time. He is currently at his cognitive baseline. EMS called and patient transported here for further evaluation  Patient is a 82 y.o. male presenting with fall. The history is provided by the EMS personnel and the patient. The history is limited by the condition of the patient.  Fall    Past Medical History  Diagnosis Date  . 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 COPD 11/05/2006  . DEMENTIA 12/19/2009  . DEPRESSION 11/05/2006  . DYSLIPIDEMIA 06/17/2008  . DYSURIA, HX OF 07/29/2008  . HERNIA 10/22/2007  . HX, PERSONAL, ALCOHOLISM 11/05/2006  . HYPERTENSION 11/05/2006  . LOW BACK PAIN 11/05/2006  . TOBACCO ABUSE 06/17/2008  . Bronchitis   . Alzheimer disease 2011   Past Surgical History  Procedure Laterality Date  . Aortobifem  2007  . Glaucoma repair      Left eye  . Pilonidal cyst excision    . Abdominal aortic aneurysm repair  Jan. 2003    Stent Graft   Family History  Problem Relation Age of Onset  . Stroke Mother   . Stroke Father    Social History  Substance Use Topics  . Smoking status: Former Smoker    Types: Cigarettes    Quit date: 06/11/2008  . Smokeless tobacco: None  . Alcohol Use: No    Review of Systems  Unable to perform ROS: Dementia      Allergies  Sulfa antibiotics  Home Medications   Prior to Admission medications   Medication Sig Start Date End Date Taking? Authorizing Provider  acetaminophen (TYLENOL) 500 MG tablet  Take 500 mg by mouth 2 (two) times daily as needed for moderate pain.    Historical Provider, MD  aspirin 81 MG chewable tablet Chew 81 mg by mouth every morning.    Historical Provider, MD  ciprofloxacin (CIPRO) 500 MG tablet Take 1 tablet (500 mg total) by mouth 2 (two) times daily. Patient not taking: Reported on 09/14/2015 04/19/15   Alison MurrayAlma M Devine, MD  divalproex (DEPAKOTE ER) 500 MG 24 hr tablet Take 500 mg by mouth daily.     Historical Provider, MD  ENSURE (ENSURE) Take 237 mLs by mouth 3 (three) times daily between meals. *Vanilla*    Historical Provider, MD  Glycerin-Hypromellose-PEG 400 (VISINE PURE TEARS) 0.2-0.2-1 % SOLN Place 1 drop into both eyes 2 (two) times daily.    Historical Provider, MD  haloperidol (HALDOL) 2 MG tablet Take 1 tablet (2 mg total) by mouth 2 (two) times daily. Patient not taking: Reported on 09/14/2015 04/19/15   Alison MurrayAlma M Devine, MD  hydrocerin (EUCERIN) CREA Apply 1 application topically daily. Face/Body    Historical Provider, MD  latanoprost (XALATAN) 0.005 % ophthalmic solution Place 1 drop into both eyes at bedtime.    Historical Provider, MD  loperamide (IMODIUM) 2 MG capsule Take 2 mg by mouth every 6 (six) hours as needed for diarrhea or loose stools.     Historical Provider, MD  loratadine (CLARITIN) 10 MG tablet Take  10 mg by mouth every morning.    Historical Provider, MD  LORazepam (ATIVAN) 0.5 MG tablet Take 0.5 mg by mouth 2 (two) times daily.     Historical Provider, MD  LORazepam (ATIVAN) 0.5 MG tablet Take 0.5 mg by mouth 2 (two) times daily as needed for anxiety.    Historical Provider, MD  metroNIDAZOLE (FLAGYL) 500 MG tablet Take 1 tablet (500 mg total) by mouth every 8 (eight) hours. Patient not taking: Reported on 09/14/2015 04/19/15   Alison Murray, MD  mirtazapine (REMERON) 30 MG tablet Take 30 mg by mouth at bedtime.    Historical Provider, MD  Multiple Vitamin (MULTIVITAMIN WITH MINERALS) TABS tablet Take 1 tablet by mouth every morning.     Historical Provider, MD  pravastatin (PRAVACHOL) 10 MG tablet Take 10 mg by mouth at bedtime.     Historical Provider, MD  QUEtiapine (SEROQUEL) 50 MG tablet Take 50-100 mg by mouth 2 (two) times daily.  in the morning and 100 mg at bedtime    Historical Provider, MD  selenium sulfide (SELSUN) 1 % LOTN Apply 1 application topically 3 (three) times a week.    Historical Provider, MD  senna-docusate (SENOKOT-S) 8.6-50 MG tablet Take 1 tablet by mouth daily.    Historical Provider, MD  Skin Protectants, Misc. (BAZA PROTECT EX) Apply 1 application topically 3 (three) times daily as needed (apply to buttocks and sacrum).     Historical Provider, MD   BP 110/88 mmHg  Pulse 84  Temp(Src) 98 F (36.7 C) (Oral)  Resp 18  SpO2 91% Physical Exam  Constitutional: He appears well-developed and well-nourished.  Non-toxic appearance. No distress.  HENT:  Head: Normocephalic and atraumatic.  Eyes: Conjunctivae, EOM and lids are normal. Pupils are equal, round, and reactive to light.  Neck: Normal range of motion. Neck supple. No tracheal deviation present. No thyroid mass present.  Cardiovascular: Normal rate, regular rhythm and normal heart sounds.  Exam reveals no gallop.   No murmur heard. Pulmonary/Chest: Effort normal and breath sounds normal. No stridor. No respiratory distress. He has no decreased breath sounds. He has no wheezes. He has no rhonchi. He has no rales.  Abdominal: Soft. Normal appearance and bowel sounds are normal. He exhibits no distension. There is no tenderness. There is no rebound and no CVA tenderness.  Musculoskeletal: Normal range of motion. He exhibits no edema or tenderness.  Neurological: He is alert. He displays no atrophy. No cranial nerve deficit or sensory deficit. He exhibits normal muscle tone. GCS eye subscore is 4. GCS verbal subscore is 5. GCS motor subscore is 6.  Skin: Skin is warm and dry. No abrasion and no rash noted.  Psychiatric: His affect is blunt.   Nursing note and vitals reviewed.   ED Course  Procedures (including critical care time) Labs Review Labs Reviewed - No data to display  Imaging Review No results found. I have personally reviewed and evaluated these images and lab results as part of my medical decision-making.   EKG Interpretation None      MDM   Final diagnoses:  Fall, initial encounter    Patient afebrile and nontoxic appearing. No visible evidence of trauma on his entire body. Stable for discharge    Lorre Nick, MD 12/17/15 2309

## 2015-12-17 NOTE — ED Notes (Signed)
[facility] from [facility]- unwitnessed fall, found on floor. Seen 5 minutes prior. No obvious injuries seen. No pain reported. At cognitive baseline. Hx of dementia.

## 2016-05-07 ENCOUNTER — Encounter (HOSPITAL_COMMUNITY): Payer: Self-pay | Admitting: Emergency Medicine

## 2016-05-07 ENCOUNTER — Emergency Department (HOSPITAL_COMMUNITY)
Admission: EM | Admit: 2016-05-07 | Discharge: 2016-05-08 | Disposition: A | Discharge status: Home / Self Care | Payer: Medicare Other | Source: Home / Self Care | Attending: Emergency Medicine | Admitting: Emergency Medicine

## 2016-05-07 DIAGNOSIS — Y939 Activity, unspecified: Secondary | ICD-10-CM | POA: Insufficient documentation

## 2016-05-07 DIAGNOSIS — W19XXXA Unspecified fall, initial encounter: Secondary | ICD-10-CM | POA: Insufficient documentation

## 2016-05-07 DIAGNOSIS — I10 Essential (primary) hypertension: Secondary | ICD-10-CM

## 2016-05-07 DIAGNOSIS — G309 Alzheimer's disease, unspecified: Secondary | ICD-10-CM | POA: Insufficient documentation

## 2016-05-07 DIAGNOSIS — R05 Cough: Secondary | ICD-10-CM | POA: Diagnosis not present

## 2016-05-07 DIAGNOSIS — J449 Chronic obstructive pulmonary disease, unspecified: Secondary | ICD-10-CM

## 2016-05-07 DIAGNOSIS — J181 Lobar pneumonia, unspecified organism: Secondary | ICD-10-CM | POA: Insufficient documentation

## 2016-05-07 DIAGNOSIS — J189 Pneumonia, unspecified organism: Secondary | ICD-10-CM

## 2016-05-07 DIAGNOSIS — Z87891 Personal history of nicotine dependence: Secondary | ICD-10-CM

## 2016-05-07 DIAGNOSIS — Y92129 Unspecified place in nursing home as the place of occurrence of the external cause: Secondary | ICD-10-CM | POA: Insufficient documentation

## 2016-05-07 DIAGNOSIS — Z7982 Long term (current) use of aspirin: Secondary | ICD-10-CM | POA: Insufficient documentation

## 2016-05-07 DIAGNOSIS — Y999 Unspecified external cause status: Secondary | ICD-10-CM

## 2016-05-07 DIAGNOSIS — S0091XA Abrasion of unspecified part of head, initial encounter: Secondary | ICD-10-CM | POA: Insufficient documentation

## 2016-05-07 NOTE — ED Notes (Signed)
[facility] Expected date:  Expected time:  Means of arrival:  Comments: 82 yo M/ FALL

## 2016-05-07 NOTE — ED Triage Notes (Signed)
Per EMS pt. [facility] with complaint of frequent falls, thrice within the last 24 hours, latest fall at 8pm , unwitnessed. Pt. Has dementia , no report of injury nor LOC. Pt. Alert and oriented x1.

## 2016-05-08 ENCOUNTER — Emergency Department (HOSPITAL_COMMUNITY): Payer: Medicare Other

## 2016-05-08 MED ORDER — AZITHROMYCIN 250 MG PO TABS: 500.0000 mg | ORAL_TABLET | Freq: Once | ORAL | Status: DC

## 2016-05-08 MED ORDER — AZITHROMYCIN 250 MG PO TABS: 250.0000 mg | ORAL_TABLET | Freq: Every day | ORAL | 0 refills | Status: DC

## 2016-05-08 NOTE — ED Notes (Signed)
[facility] called for transport.  

## 2016-05-08 NOTE — ED Provider Notes (Signed)
[facility]-EMERGENCY DEPT Provider Note   CSN: [id number] Arrival date & time: 05/07/16  2346   By signing my name below, I, [name], attest that this documentation has been prepared under the direction and in the presence of Riley CrumbleAdeleke Brinly Maietta, MD. Electronically signed, [name], ED Scribe. 05/08/16. 12:35 AM.   History   Chief Complaint Chief Complaint  Patient presents with  . Fall   LEVEL 5 CAVEAT DUE TO PT DEMENTIA The history is provided by the spouse and a relative. The history is limited by the condition of the patient. No language interpreter was used.   HPI Comments: [name] is a 82 y.o. male with Alzheimer's disease who presents to the Emergency Department s/p a fall this evening. Pt was brought in by EMS. Per triage, the pt began to fall more frequently recently, he fell 3 times within the past 24 hours, and his latest fall was ~8:00 PM and unwitnessed. Per daughter, pt takes aspirin regularly. Family further reports that he has been acting "weird" lately, and he does not recognize close family members because of his dementia. No injury or LOC noted.  Past Medical History:  Diagnosis Date  . Alzheimer disease 2011  . Bronchitis   . 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 COPD 11/05/2006  . DEMENTIA 12/19/2009  . DEPRESSION 11/05/2006  . DYSLIPIDEMIA 06/17/2008  . DYSURIA, HX OF 07/29/2008  . HERNIA 10/22/2007  . HX, PERSONAL, ALCOHOLISM 11/05/2006  . HYPERTENSION 11/05/2006  . LOW BACK PAIN 11/05/2006  . TOBACCO ABUSE 06/17/2008    Patient Active Problem List   Diagnosis Date Noted  . Colitis presumed infectious 04/15/2015  . Diarrhea 04/15/2015  . AKI (acute kidney injury) (HCC) 04/15/2015  . Occlusion and stenosis of carotid artery without mention of cerebral infarction 01/16/2012  . DEMENTIA 12/19/2009  . 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 DYSURIA, HX OF 07/29/2008  . DYSLIPIDEMIA 06/17/2008  . TOBACCO ABUSE 06/17/2008  . HERNIA  10/22/2007  . DEPRESSION 11/05/2006  . Essential hypertension 11/05/2006  . COPD 11/05/2006  . LOW BACK PAIN 11/05/2006  . HX, PERSONAL, ALCOHOLISM 11/05/2006    Past Surgical History:  Procedure Laterality Date  . ABDOMINAL AORTIC ANEURYSM REPAIR  Jan. 2003   Stent Graft  . Aortobifem  2007  . GLAUCOMA REPAIR     Left eye  . PILONIDAL CYST EXCISION         Home Medications    Prior to Admission medications   Medication Sig Start Date End Date Taking? Authorizing Provider  acetaminophen (TYLENOL) 500 MG tablet Take 500 mg by mouth 2 (two) times daily as needed for moderate pain.    Historical Provider, MD  aspirin 81 MG chewable tablet Chew 81 mg by mouth every morning.    Historical Provider, MD  ciprofloxacin (CIPRO) 500 MG tablet Take 1 tablet (500 mg total) by mouth 2 (two) times daily. Patient not taking: Reported on 09/14/2015 04/19/15   Alison MurrayAlma M Devine, MD  divalproex (DEPAKOTE ER) 500 MG 24 hr tablet Take 500 mg by mouth daily.     Historical Provider, MD  ENSURE (ENSURE) Take 237 mLs by mouth 3 (three) times daily between meals. *Vanilla*    Historical Provider, MD  Glycerin-Hypromellose-PEG 400 (VISINE PURE TEARS) 0.2-0.2-1 % SOLN Place 1 drop into both eyes 2 (two) times daily.    Historical Provider, MD  haloperidol (HALDOL) 2 MG tablet Take 1 tablet (2 mg total) by mouth 2 (two) times daily. Patient not taking: Reported  on 09/14/2015 04/19/15   Alison MurrayAlma M Devine, MD  hydrocerin (EUCERIN) CREA Apply 1 application topically daily. Face/Body    Historical Provider, MD  latanoprost (XALATAN) 0.005 % ophthalmic solution Place 1 drop into both eyes at bedtime.    Historical Provider, MD  loperamide (IMODIUM) 2 MG capsule Take 2 mg by mouth every 6 (six) hours as needed for diarrhea or loose stools.     Historical Provider, MD  loratadine (CLARITIN) 10 MG tablet Take 10 mg by mouth every morning.    Historical Provider, MD  LORazepam (ATIVAN) 0.5 MG tablet Take 0.5 mg by mouth 2 (two)  times daily.     Historical Provider, MD  LORazepam (ATIVAN) 0.5 MG tablet Take 0.5 mg by mouth 2 (two) times daily as needed for anxiety.    Historical Provider, MD  metroNIDAZOLE (FLAGYL) 500 MG tablet Take 1 tablet (500 mg total) by mouth every 8 (eight) hours. Patient not taking: Reported on 09/14/2015 04/19/15   Alison MurrayAlma M Devine, MD  mirtazapine (REMERON) 30 MG tablet Take 30 mg by mouth at bedtime.    Historical Provider, MD  Multiple Vitamin (MULTIVITAMIN WITH MINERALS) TABS tablet Take 1 tablet by mouth every morning.    Historical Provider, MD  pravastatin (PRAVACHOL) 10 MG tablet Take 10 mg by mouth at bedtime.     Historical Provider, MD  QUEtiapine (SEROQUEL) 50 MG tablet Take 50-100 mg by mouth 2 (two) times daily. 50mg  in the morning and 100 mg at bedtime    Historical Provider, MD  selenium sulfide (SELSUN) 1 % LOTN Apply 1 application topically 3 (three) times a week.    Historical Provider, MD  senna-docusate (SENOKOT-S) 8.6-50 MG tablet Take 1 tablet by mouth daily.    Historical Provider, MD  Skin Protectants, Misc. (BAZA PROTECT EX) Apply 1 application topically 3 (three) times daily as needed (apply to buttocks and sacrum).     Historical Provider, MD    Family History Family History  Problem Relation Age of Onset  . Stroke Mother   . Stroke Father     Social History Social History  Substance Use Topics  . Smoking status: Former Smoker    Types: Cigarettes    Quit date: 06/11/2008  . Smokeless tobacco: Not on file  . Alcohol use No     Allergies   Sulfa antibiotics   Review of Systems Review of Systems 10 Systems reviewed and all are negative for acute change except as noted in the HPI.    Physical Exam Updated Vital Signs BP 105/74 (BP Location: Left Arm)   Pulse 107   Temp 98.5 F (36.9 C) (Oral)   Resp 20   SpO2 95%   Physical Exam  Constitutional: Vital signs are normal. He appears well-developed and well-nourished.  Non-toxic appearance. He does not  appear ill. No distress.  HENT:  Head: Normocephalic.  Nose: Nose normal.  Mouth/Throat: Oropharynx is clear and moist. No oropharyngeal exudate.  .5 cm abrasion to the right top of his head.  Eyes: Conjunctivae and EOM are normal. Pupils are equal, round, and reactive to light. No scleral icterus.  Neck: Normal range of motion. Neck supple. No tracheal deviation, no edema, no erythema and normal range of motion present. No thyroid mass and no thyromegaly present.  Cardiovascular: Normal rate, regular rhythm, S1 normal, S2 normal, normal heart sounds, intact distal pulses and normal pulses.  Exam reveals no gallop and no friction rub.   No murmur heard. Pulmonary/Chest: Effort normal  and breath sounds normal. No respiratory distress. He has no wheezes. He has no rhonchi. He has no rales.  Abdominal: Soft. Normal appearance and bowel sounds are normal. He exhibits no distension, no ascites and no mass. There is no hepatosplenomegaly. There is no tenderness. There is no rebound, no guarding and no CVA tenderness.  Musculoskeletal: Normal range of motion. He exhibits no edema or tenderness.  Lymphadenopathy:    He has no cervical adenopathy.  Neurological: He is alert. He has normal strength.  Moves all four extremities, but will not follow commands.  Skin: Skin is warm, dry and intact. No petechiae and no rash noted. He is not diaphoretic. No erythema. No pallor.  Nursing note and vitals reviewed.    ED Treatments / Results  DIAGNOSTIC STUDIES: Oxygen Saturation is 95% on RA, adequate by my interpretation.    COORDINATION OF CARE: 12:35 AM Discussed treatment plan with pt at bedside and pt agreed to plan.  Labs (all labs ordered are listed, but only abnormal results are displayed) Labs Reviewed - No data to display  EKG  EKG Interpretation None       Radiology No results found.  Procedures Procedures (including critical care time)  Medications Ordered in ED Medications -  No data to display   Initial Impression / Assessment and Plan / ED Course  I have reviewed the triage vital signs and the nursing notes.  Pertinent labs & imaging results that were available during my care of the patient were reviewed by me and considered in my medical decision making (see chart for details).  Clinical Course     Patient presents to the ED for frequent falls. He does have evidence of external trauma.  Plan to obtain CT for evaluation.  Also has a productive sounding cough on exam.  CXR is pending to eval for possible pneumonia as cause of his fall.     2:07 AM CXR shows possible pneumonia.  Plan to treat with azithro x 5 days.  First dose will be given in the ED. CT scan still pending.    2:59 AM CT does not show any trauma.  Ok for DC home.  He is currently sleeping comfortably, no hypoxia, and NAD.  VS remain within his normal limits and he is safe for DC.  Final Clinical Impressions(s) / ED Diagnoses   Final diagnoses:  None    New Prescriptions New Prescriptions   No medications on file    I personally performed the services described in this documentation, which was scribed in my presence. The recorded information has been reviewed and is accurate.      Riley Crumble, MD 05/08/16 0300

## 2016-05-09 ENCOUNTER — Inpatient Hospital Stay (HOSPITAL_COMMUNITY)
Admission: EM | Admit: 2016-05-09 | Discharge: 2016-05-14 | DRG: 193 | Disposition: A | Discharge status: Hospice | Payer: Medicare Other | Attending: Internal Medicine | Admitting: Internal Medicine

## 2016-05-09 ENCOUNTER — Emergency Department (HOSPITAL_COMMUNITY): Payer: Medicare Other

## 2016-05-09 ENCOUNTER — Encounter (HOSPITAL_COMMUNITY): Payer: Self-pay | Admitting: Emergency Medicine

## 2016-05-09 DIAGNOSIS — Z681 Body mass index (BMI) 19 or less, adult: Secondary | ICD-10-CM

## 2016-05-09 DIAGNOSIS — G9341 Metabolic encephalopathy: Secondary | ICD-10-CM | POA: Diagnosis present

## 2016-05-09 DIAGNOSIS — J440 Chronic obstructive pulmonary disease with acute lower respiratory infection: Secondary | ICD-10-CM | POA: Diagnosis present

## 2016-05-09 DIAGNOSIS — J181 Lobar pneumonia, unspecified organism: Secondary | ICD-10-CM

## 2016-05-09 DIAGNOSIS — R1032 Left lower quadrant pain: Secondary | ICD-10-CM | POA: Diagnosis not present

## 2016-05-09 DIAGNOSIS — J189 Pneumonia, unspecified organism: Principal | ICD-10-CM | POA: Diagnosis present

## 2016-05-09 DIAGNOSIS — Z7982 Long term (current) use of aspirin: Secondary | ICD-10-CM

## 2016-05-09 DIAGNOSIS — J9600 Acute respiratory failure, unspecified whether with hypoxia or hypercapnia: Secondary | ICD-10-CM | POA: Diagnosis present

## 2016-05-09 DIAGNOSIS — I10 Essential (primary) hypertension: Secondary | ICD-10-CM | POA: Diagnosis not present

## 2016-05-09 DIAGNOSIS — Y95 Nosocomial condition: Secondary | ICD-10-CM | POA: Diagnosis present

## 2016-05-09 DIAGNOSIS — Z66 Do not resuscitate: Secondary | ICD-10-CM | POA: Diagnosis present

## 2016-05-09 DIAGNOSIS — F0281 Dementia in other diseases classified elsewhere with behavioral disturbance: Secondary | ICD-10-CM | POA: Diagnosis present

## 2016-05-09 DIAGNOSIS — F05 Delirium due to known physiological condition: Secondary | ICD-10-CM | POA: Diagnosis present

## 2016-05-09 DIAGNOSIS — E876 Hypokalemia: Secondary | ICD-10-CM | POA: Diagnosis present

## 2016-05-09 DIAGNOSIS — E785 Hyperlipidemia, unspecified: Secondary | ICD-10-CM | POA: Diagnosis present

## 2016-05-09 DIAGNOSIS — F29 Unspecified psychosis not due to a substance or known physiological condition: Secondary | ICD-10-CM | POA: Diagnosis present

## 2016-05-09 DIAGNOSIS — F419 Anxiety disorder, unspecified: Secondary | ICD-10-CM | POA: Diagnosis present

## 2016-05-09 DIAGNOSIS — S22080A Wedge compression fracture of T11-T12 vertebra, initial encounter for closed fracture: Secondary | ICD-10-CM | POA: Diagnosis present

## 2016-05-09 DIAGNOSIS — Y92129 Unspecified place in nursing home as the place of occurrence of the external cause: Secondary | ICD-10-CM

## 2016-05-09 DIAGNOSIS — R1310 Dysphagia, unspecified: Secondary | ICD-10-CM | POA: Diagnosis present

## 2016-05-09 DIAGNOSIS — Z7189 Other specified counseling: Secondary | ICD-10-CM | POA: Diagnosis not present

## 2016-05-09 DIAGNOSIS — F03918 Unspecified dementia, unspecified severity, with other behavioral disturbance: Secondary | ICD-10-CM | POA: Diagnosis present

## 2016-05-09 DIAGNOSIS — F0391 Unspecified dementia with behavioral disturbance: Secondary | ICD-10-CM | POA: Diagnosis present

## 2016-05-09 DIAGNOSIS — Z515 Encounter for palliative care: Secondary | ICD-10-CM | POA: Diagnosis not present

## 2016-05-09 DIAGNOSIS — Z8679 Personal history of other diseases of the circulatory system: Secondary | ICD-10-CM

## 2016-05-09 DIAGNOSIS — Z882 Allergy status to sulfonamides status: Secondary | ICD-10-CM

## 2016-05-09 DIAGNOSIS — E860 Dehydration: Secondary | ICD-10-CM | POA: Diagnosis present

## 2016-05-09 DIAGNOSIS — Z79899 Other long term (current) drug therapy: Secondary | ICD-10-CM

## 2016-05-09 DIAGNOSIS — E43 Unspecified severe protein-calorie malnutrition: Secondary | ICD-10-CM | POA: Diagnosis present

## 2016-05-09 DIAGNOSIS — D703 Neutropenia due to infection: Secondary | ICD-10-CM | POA: Diagnosis not present

## 2016-05-09 DIAGNOSIS — Z823 Family history of stroke: Secondary | ICD-10-CM

## 2016-05-09 DIAGNOSIS — N179 Acute kidney failure, unspecified: Secondary | ICD-10-CM | POA: Diagnosis not present

## 2016-05-09 DIAGNOSIS — G309 Alzheimer's disease, unspecified: Secondary | ICD-10-CM | POA: Diagnosis present

## 2016-05-09 DIAGNOSIS — Z87891 Personal history of nicotine dependence: Secondary | ICD-10-CM

## 2016-05-09 DIAGNOSIS — Z781 Physical restraint status: Secondary | ICD-10-CM | POA: Diagnosis not present

## 2016-05-09 DIAGNOSIS — R1011 Right upper quadrant pain: Secondary | ICD-10-CM

## 2016-05-09 DIAGNOSIS — R05 Cough: Secondary | ICD-10-CM | POA: Diagnosis present

## 2016-05-09 DIAGNOSIS — W19XXXA Unspecified fall, initial encounter: Secondary | ICD-10-CM | POA: Diagnosis present

## 2016-05-09 HISTORY — DX: Repeated falls: R29.6

## 2016-05-09 LAB — CBC WITH DIFFERENTIAL/PLATELET
BASOS ABS: 0 10*3/uL (ref 0.0–0.1)
BASOS PCT: 0 %
EOS PCT: 2 %
Eosinophils Absolute: 0.1 10*3/uL (ref 0.0–0.7)
HCT: 35.2 % — ABNORMAL LOW (ref 39.0–52.0)
Hemoglobin: 11.5 g/dL — ABNORMAL LOW (ref 13.0–17.0)
LYMPHS PCT: 15 %
Lymphs Abs: 0.7 10*3/uL (ref 0.7–4.0)
MCH: 31.1 pg (ref 26.0–34.0)
MCHC: 32.7 g/dL (ref 30.0–36.0)
MCV: 95.1 fL (ref 78.0–100.0)
MONO ABS: 0.6 10*3/uL (ref 0.1–1.0)
Monocytes Relative: 13 %
Neutro Abs: 3.4 10*3/uL (ref 1.7–7.7)
Neutrophils Relative %: 70 %
PLATELETS: 192 10*3/uL (ref 150–400)
RBC: 3.7 MIL/uL — AB (ref 4.22–5.81)
RDW: 14.8 % (ref 11.5–15.5)
WBC: 4.8 10*3/uL (ref 4.0–10.5)

## 2016-05-09 LAB — COMPREHENSIVE METABOLIC PANEL
ALBUMIN: 3.6 g/dL (ref 3.5–5.0)
ALT: 16 U/L — AB (ref 17–63)
AST: 20 U/L (ref 15–41)
Alkaline Phosphatase: 60 U/L (ref 38–126)
Anion gap: 6 (ref 5–15)
BUN: 23 mg/dL — AB (ref 6–20)
CHLORIDE: 107 mmol/L (ref 101–111)
CO2: 29 mmol/L (ref 22–32)
CREATININE: 1.06 mg/dL (ref 0.61–1.24)
Calcium: 9 mg/dL (ref 8.9–10.3)
GFR calc Af Amer: >60 mL/min (ref >60)
GFR calc non Af Amer: >60 mL/min (ref >60)
GLUCOSE: 127 mg/dL — AB (ref 65–99)
POTASSIUM: 3.4 mmol/L — AB (ref 3.5–5.1)
SODIUM: 142 mmol/L (ref 135–145)
Total Bilirubin: 0.7 mg/dL (ref 0.3–1.2)
Total Protein: 7.1 g/dL (ref 6.5–8.1)

## 2016-05-09 LAB — BLOOD GAS, VENOUS
Acid-Base Excess: 2.8 mmol/L — ABNORMAL HIGH (ref 0.0–2.0)
Bicarbonate: 28 mmol/L (ref 20.0–28.0)
O2 Saturation: 34.7 %
PATIENT TEMPERATURE: 98.6
PH VEN: 7.378 (ref 7.250–7.430)
pCO2, Ven: 48.7 mmHg (ref 44.0–60.0)

## 2016-05-09 LAB — CREATININE, SERUM
CREATININE: 0.78 mg/dL (ref 0.61–1.24)
GFR calc non Af Amer: >60 mL/min (ref >60)

## 2016-05-09 LAB — URINALYSIS, ROUTINE W REFLEX MICROSCOPIC
GLUCOSE, UA: NEGATIVE mg/dL
HGB URINE DIPSTICK: NEGATIVE
Ketones, ur: NEGATIVE mg/dL
Leukocytes, UA: NEGATIVE
Nitrite: NEGATIVE
PH: 5.5 (ref 5.0–8.0)
Protein, ur: 30 mg/dL — AB
SPECIFIC GRAVITY, URINE: 1.029 (ref 1.005–1.030)

## 2016-05-09 LAB — CBC
HCT: 34.5 % — ABNORMAL LOW (ref 39.0–52.0)
HEMOGLOBIN: 11.3 g/dL — AB (ref 13.0–17.0)
MCH: 30.8 pg (ref 26.0–34.0)
MCHC: 32.8 g/dL (ref 30.0–36.0)
MCV: 94 fL (ref 78.0–100.0)
PLATELETS: 199 10*3/uL (ref 150–400)
RBC: 3.67 MIL/uL — AB (ref 4.22–5.81)
RDW: 14.5 % (ref 11.5–15.5)
WBC: 4 10*3/uL (ref 4.0–10.5)

## 2016-05-09 LAB — URINE MICROSCOPIC-ADD ON

## 2016-05-09 LAB — AMMONIA: AMMONIA: 10 umol/L (ref 9–35)

## 2016-05-09 LAB — TROPONIN I: Troponin I: <0.03 ng/mL (ref <0.03)

## 2016-05-09 LAB — MRSA PCR SCREENING: MRSA by PCR: NEGATIVE

## 2016-05-09 MED ORDER — LEVOFLOXACIN IN D5W 750 MG/150ML IV SOLN: 750.0000 mg | INTRAVENOUS | Status: DC

## 2016-05-09 MED ORDER — LORAZEPAM 2 MG/ML IJ SOLN
0.5000 mg | Freq: Once | INTRAMUSCULAR | Status: AC
  Administered 2016-05-09: 0.5 mg via INTRAVENOUS
  Filled 2016-05-09: qty 1

## 2016-05-09 MED ORDER — SODIUM CHLORIDE 0.9 % IV BOLUS (SEPSIS)
1000.0000 mL | Freq: Once | INTRAVENOUS | Status: AC
  Administered 2016-05-09: 1000 mL via INTRAVENOUS

## 2016-05-09 MED ORDER — LEVOFLOXACIN IN D5W 750 MG/150ML IV SOLN
750.0000 mg | INTRAVENOUS | Status: DC
  Administered 2016-05-10 – 2016-05-13 (×4): 750 mg via INTRAVENOUS
  Filled 2016-05-09 (×4): qty 150

## 2016-05-09 MED ORDER — ENOXAPARIN SODIUM 40 MG/0.4ML ~~LOC~~ SOLN
40.0000 mg | SUBCUTANEOUS | Status: DC
  Administered 2016-05-09 – 2016-05-14 (×6): 40 mg via SUBCUTANEOUS
  Filled 2016-05-09 (×6): qty 0.4

## 2016-05-09 MED ORDER — ZIPRASIDONE MESYLATE 20 MG IM SOLR
10.0000 mg | Freq: Once | INTRAMUSCULAR | Status: AC
  Administered 2016-05-09: 10 mg via INTRAMUSCULAR
  Filled 2016-05-09: qty 20

## 2016-05-09 MED ORDER — STERILE WATER FOR INJECTION IJ SOLN
INTRAMUSCULAR | Status: AC
  Administered 2016-05-09: 1.2 mL
  Filled 2016-05-09: qty 10

## 2016-05-09 MED ORDER — DEXTROSE-NACL 5-0.45 % IV SOLN
INTRAVENOUS | Status: DC
  Administered 2016-05-09 – 2016-05-12 (×3): via INTRAVENOUS

## 2016-05-09 MED ORDER — LEVOFLOXACIN IN D5W 500 MG/100ML IV SOLN
500.0000 mg | Freq: Once | INTRAVENOUS | Status: AC
  Administered 2016-05-09: 500 mg via INTRAVENOUS
  Filled 2016-05-09: qty 100

## 2016-05-09 MED ORDER — HALOPERIDOL LACTATE 5 MG/ML IJ SOLN
0.5000 mg | Freq: Four times a day (QID) | INTRAMUSCULAR | Status: DC | PRN
  Administered 2016-05-10 – 2016-05-11 (×3): 0.5 mg via INTRAVENOUS
  Filled 2016-05-09 (×4): qty 1
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
## 2016-05-09 NOTE — ED Provider Notes (Signed)
[facility]-EMERGENCY DEPT Provider Note   CSN: [id number] Arrival date & time: 05/09/16  6578     History   Chief Complaint Chief Complaint  Patient presents with  . Aggressive Behavior    HPI LESLEE HAUETER is a 82 y.o. male.  Patient has had a cough and some shortness of breath he was seen in emergency department yesterday and treated for pneumonia. Patient more confused today    Cough  This is a new problem. The current episode started 2 days ago. The problem occurs constantly. The problem has not changed since onset.The cough is non-productive. There has been no fever. Pertinent negatives include no chest pain and no headaches. He has tried nothing for the symptoms. The treatment provided no relief. Risk factors: Lives in a rest home. He is not a smoker. His past medical history is significant for pneumonia.    Past Medical History:  Diagnosis Date  . Alzheimer disease 2011  . Bronchitis   . 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 COPD 11/05/2006  . DEMENTIA 12/19/2009  . DEPRESSION 11/05/2006  . DYSLIPIDEMIA 06/17/2008  . DYSURIA, HX OF 07/29/2008  . HERNIA 10/22/2007  . HX, PERSONAL, ALCOHOLISM 11/05/2006  . HYPERTENSION 11/05/2006  . LOW BACK PAIN 11/05/2006  . TOBACCO ABUSE 06/17/2008    Patient Active Problem List   Diagnosis Date Noted  . Colitis presumed infectious 04/15/2015  . Diarrhea 04/15/2015  . AKI (acute kidney injury) (HCC) 04/15/2015  . Occlusion and stenosis of carotid artery without mention of cerebral infarction 01/16/2012  . DEMENTIA 12/19/2009  . 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 DYSURIA, HX OF 07/29/2008  . DYSLIPIDEMIA 06/17/2008  . TOBACCO ABUSE 06/17/2008  . HERNIA 10/22/2007  . DEPRESSION 11/05/2006  . Essential hypertension 11/05/2006  . COPD 11/05/2006  . LOW BACK PAIN 11/05/2006  . HX, PERSONAL, ALCOHOLISM 11/05/2006    Past Surgical History:  Procedure Laterality Date  . ABDOMINAL AORTIC ANEURYSM REPAIR  Jan. 2003     Stent Graft  . Aortobifem  2007  . GLAUCOMA REPAIR     Left eye  . PILONIDAL CYST EXCISION         Home Medications    Prior to Admission medications   Medication Sig Start Date End Date Taking? Authorizing Provider  acetaminophen (TYLENOL) 500 MG tablet Take 500 mg by mouth 2 (two) times daily as needed for moderate pain.   Yes Historical Provider, MD  aspirin 81 MG chewable tablet Chew 81 mg by mouth every morning.   Yes Historical Provider, MD  azithromycin (ZITHROMAX) 250 MG tablet Take 1 tablet (250 mg total) by mouth daily. Take 2 tabs day 1, then take 1 tab day 2-5. 05/08/16  Yes Tomasita Crumble, MD  divalproex (DEPAKOTE ER) 500 MG 24 hr tablet Take 500 mg by mouth daily with breakfast.    Yes Historical Provider, MD  ENSURE (ENSURE) Take 237 mLs by mouth 3 (three) times daily between meals. *Vanilla*   Yes Historical Provider, MD  Glycerin-Hypromellose-PEG 400 (VISINE PURE TEARS) 0.2-0.2-1 % SOLN Place 1 drop into both eyes 2 (two) times daily.   Yes Historical Provider, MD  latanoprost (XALATAN) 0.005 % ophthalmic solution Place 1 drop into both eyes at bedtime.   Yes Historical Provider, MD  loperamide (IMODIUM) 2 MG capsule Take 2 mg by mouth every 6 (six) hours as needed for diarrhea or loose stools.    Yes Historical Provider, MD  loratadine (CLARITIN) 10 MG tablet Take 10 mg by mouth every  morning.   Yes Historical Provider, MD  LORazepam (ATIVAN) 0.5 MG tablet Take 0.5 mg by mouth 2 (two) times daily as needed (aggitation).    Yes Historical Provider, MD  LORazepam (ATIVAN) 0.5 MG tablet Take 0.5 mg by mouth 2 (two) times daily.    Yes Historical Provider, MD  mirtazapine (REMERON) 30 MG tablet Take 30 mg by mouth at bedtime.   Yes Historical Provider, MD  Multiple Vitamin (MULTIVITAMIN WITH MINERALS) TABS tablet Take 1 tablet by mouth every morning.   Yes Historical Provider, MD  pravastatin (PRAVACHOL) 10 MG tablet Take 10 mg by mouth at bedtime.    Yes Historical Provider,  MD  QUEtiapine (SEROQUEL) 50 MG tablet Take 50-100 mg by mouth 2 (two) times daily. 50mg  in the morning and 100 mg at bedtime   Yes Historical Provider, MD  selenium sulfide (SELSUN) 1 % LOTN Apply 1 application topically 3 (three) times a week. Uses on Tuesday, Thursday, and Friday   Yes Historical Provider, MD  senna-docusate (SENOKOT-S) 8.6-50 MG tablet Take 1 tablet by mouth daily with breakfast.    Yes Historical Provider, MD  Skin Protectants, Misc. (BAZA PROTECT EX) Apply 1 application topically 3 (three) times daily as needed (apply to buttocks and sacrum).    Yes Historical Provider, MD  Skin Protectants, Misc. (MINERIN) CREA Apply 1 application topically daily.   Yes Historical Provider, MD  traMADol (ULTRAM) 50 MG tablet Take 50 mg by mouth every 6 (six) hours as needed (for pain).   Yes Historical Provider, MD  haloperidol (HALDOL) 2 MG tablet Take 1 tablet (2 mg total) by mouth 2 (two) times daily. Patient not taking: Reported on 05/09/2016 04/19/15   Alison Murray, MD    Family History Family History  Problem Relation Age of Onset  . Stroke Mother   . Stroke Father     Social History Social History  Substance Use Topics  . Smoking status: Former Smoker    Types: Cigarettes    Quit date: 06/11/2008  . Smokeless tobacco: Never Used  . Alcohol use No     Allergies   Sulfa antibiotics   Review of Systems Review of Systems  Constitutional: Negative for appetite change and fatigue.  HENT: Negative for congestion, ear discharge and sinus pressure.   Eyes: Negative for discharge.  Respiratory: Positive for cough.   Cardiovascular: Negative for chest pain.  Gastrointestinal: Negative for abdominal pain and diarrhea.  Genitourinary: Negative for frequency and hematuria.  Musculoskeletal: Negative for back pain.  Skin: Negative for rash.  Neurological: Negative for seizures and headaches.  Psychiatric/Behavioral: Negative for hallucinations.     Physical Exam Updated  Vital Signs BP (!) 122/51   Pulse 82   Temp 98.1 F (36.7 C) (Oral)   Resp 16   Ht 5\' 9"  (1.753 m)   Wt 113 lb (51.3 kg)   SpO2 94%   BMI 16.69 kg/m   Physical Exam  Constitutional: He is oriented to person, place, and time. He appears well-developed.  HENT:  Head: Normocephalic.  Eyes: Conjunctivae and EOM are normal. No scleral icterus.  Neck: Neck supple. No thyromegaly present.  Cardiovascular: Normal rate and regular rhythm.  Exam reveals no gallop and no friction rub.   No murmur heard. Pulmonary/Chest: No stridor. He has no wheezes. He has no rales. He exhibits no tenderness.  Abdominal: He exhibits no distension. There is no tenderness. There is no rebound.  Musculoskeletal: Normal range of motion. He exhibits no edema.  Lymphadenopathy:    He has no cervical adenopathy.  Neurological: He is oriented to person, place, and time. He exhibits normal muscle tone. Coordination normal.  Skin: No rash noted. No erythema.  Psychiatric: He has a normal mood and affect. His behavior is normal.     ED Treatments / Results  Labs (all labs ordered are listed, but only abnormal results are displayed) Labs Reviewed  CBC WITH DIFFERENTIAL/PLATELET - Abnormal; Notable for the following:       Result Value   RBC 3.70 (*)    Hemoglobin 11.5 (*)    HCT 35.2 (*)    All other components within normal limits  COMPREHENSIVE METABOLIC PANEL - Abnormal; Notable for the following:    Potassium 3.4 (*)    Glucose, Bld 127 (*)    BUN 23 (*)    ALT 16 (*)    All other components within normal limits  URINALYSIS, ROUTINE W REFLEX MICROSCOPIC (NOT [facility]) - Abnormal; Notable for the following:    Color, Urine AMBER (*)    Bilirubin Urine SMALL (*)    Protein, ur 30 (*)    All other components within normal limits  URINE MICROSCOPIC-ADD ON - Abnormal; Notable for the following:    Squamous Epithelial / LPF 0-5 (*)    Bacteria, UA RARE (*)    All other components within normal limits    CULTURE, BLOOD (ROUTINE X 2)  CULTURE, BLOOD (ROUTINE X 2)  CORD BLOOD GAS (ARTERIAL)    EKG  EKG Interpretation None       Radiology Dg Chest 2 View  Result Date: 05/09/2016 CLINICAL DATA:  Altered mental status, cough, recent pneumonia EXAM: CHEST  2 VIEW COMPARISON:  05/08/2016 FINDINGS: Persistent streaky infiltrate/ pneumonia right lower lobe infrahilar. New streaky infiltrate or atelectasis left lower lobe. No pulmonary edema. Degenerative changes thoracic spine. There are compression deformities lower thoracic spine of indeterminate age. Clinical correlation is necessary. IMPRESSION: Persistent streaky infiltrate/ pneumonia right lower lobe infrahilar. New streaky infiltrate or atelectasis left lower lobe. Followup PA and lateral chest X-ray is recommended in 3-4 weeks following trial of antibiotic therapy to ensure resolution and exclude underlying malignancy. Compression deformities lower thoracic spine of indeterminate age. Clinical correlation is necessary. Electronically Signed   By: Natasha MeadLiviu  Pop M.D.   On: 05/09/2016 09:35   Dg Chest 2 View  Result Date: 05/08/2016 CLINICAL DATA:  Unwitnessed fall at nursing home cough EXAM: CHEST  2 VIEW COMPARISON:  06/22/2014, FINDINGS: Mildly low lung volumes. Consolidative opacity at the right middle lobe and right lung base. No effusion. Heart size normal. Atherosclerosis. No pneumothorax. IMPRESSION: Focal opacity at the right infrahilar lung, could reflect pneumonia although a mass is not excluded. Radiographic follow-up to resolution recommended, if no resolution, CT follow-up would be advised. Electronically Signed   By: Jasmine PangKim  Fujinaga M.D.   On: 05/08/2016 02:02   Ct Head Wo Contrast  Result Date: 05/08/2016 CLINICAL DATA:  Unwitnessed fall injury to top of right side of head dementia EXAM: CT HEAD WITHOUT CONTRAST CT CERVICAL SPINE WITHOUT CONTRAST TECHNIQUE: Multidetector CT imaging of the head and cervical spine was performed  following the standard protocol without intravenous contrast. Multiplanar CT image reconstructions of the cervical spine were also generated. COMPARISON:  10/04/2005 FINDINGS: CT HEAD FINDINGS Brain: No acute territorial infarction, intracranial hemorrhage or focal mass lesion is visualized. There is no midline shift. Mild to moderate ventricular enlargement, increased compared to prior but likely related to atrophy. There  is moderate atrophy which has progressed since the previous exam. There are mild white matter hypodensities, felt consistent with small vessel disease. Vascular: Carotid artery calcifications. Mild increased density in the region of left MCA on axial views but on coronal views, there appears to be mild dense linear artifact in this region. Skull: Mastoid air cells are clear.  There is no skull fracture. Sinuses/Orbits: Mucosal thickening and retention cysts in the maxillary, ethmoid and frontal sinuses. Old appearing fracture of the medial wall of the right orbit. No acute orbital abnormality. Other: None CT CERVICAL SPINE FINDINGS Alignment: No subluxation is seen. Facet alignment maintained. Appearance at C1-C2 is thought related to head positioning. Skull base and vertebrae: Craniovertebral junction appears intact. Vertebral body heights are maintained. There is no fracture identified. Soft tissues and spinal canal: No prevertebral fluid or swelling. No visible canal hematoma. Disc levels: Moderate narrowing, endplate changes and osteophyte at C5-C6 and C6-C7. Mild to moderate canal stenosis at C5-C6 and C6-C7. Posterior disc osteophyte complex at C6-C7. Multilevel hyper trophic facet arthropathy results in multilevel foraminal stenosis. Upper chest: Emphysematous disease in the apices. Other: Thyroid gland is within normal limits. Carotid artery calcifications. IMPRESSION: 1. No definite CT evidence for acute intracranial abnormality. Mild periventricular white matter small vessel disease.  Progression of atrophy. 2. No definite CT evidence for acute fracture or malalignment of the cervical spine. 3. Emphysematous disease in the apices. Electronically Signed   By: Jasmine PangKim  Fujinaga M.D.   On: 05/08/2016 02:38   Ct Cervical Spine Wo Contrast  Result Date: 05/08/2016 CLINICAL DATA:  Unwitnessed fall injury to top of right side of head dementia EXAM: CT HEAD WITHOUT CONTRAST CT CERVICAL SPINE WITHOUT CONTRAST TECHNIQUE: Multidetector CT imaging of the head and cervical spine was performed following the standard protocol without intravenous contrast. Multiplanar CT image reconstructions of the cervical spine were also generated. COMPARISON:  10/04/2005 FINDINGS: CT HEAD FINDINGS Brain: No acute territorial infarction, intracranial hemorrhage or focal mass lesion is visualized. There is no midline shift. Mild to moderate ventricular enlargement, increased compared to prior but likely related to atrophy. There is moderate atrophy which has progressed since the previous exam. There are mild white matter hypodensities, felt consistent with small vessel disease. Vascular: Carotid artery calcifications. Mild increased density in the region of left MCA on axial views but on coronal views, there appears to be mild dense linear artifact in this region. Skull: Mastoid air cells are clear.  There is no skull fracture. Sinuses/Orbits: Mucosal thickening and retention cysts in the maxillary, ethmoid and frontal sinuses. Old appearing fracture of the medial wall of the right orbit. No acute orbital abnormality. Other: None CT CERVICAL SPINE FINDINGS Alignment: No subluxation is seen. Facet alignment maintained. Appearance at C1-C2 is thought related to head positioning. Skull base and vertebrae: Craniovertebral junction appears intact. Vertebral body heights are maintained. There is no fracture identified. Soft tissues and spinal canal: No prevertebral fluid or swelling. No visible canal hematoma. Disc levels: Moderate  narrowing, endplate changes and osteophyte at C5-C6 and C6-C7. Mild to moderate canal stenosis at C5-C6 and C6-C7. Posterior disc osteophyte complex at C6-C7. Multilevel hyper trophic facet arthropathy results in multilevel foraminal stenosis. Upper chest: Emphysematous disease in the apices. Other: Thyroid gland is within normal limits. Carotid artery calcifications. IMPRESSION: 1. No definite CT evidence for acute intracranial abnormality. Mild periventricular white matter small vessel disease. Progression of atrophy. 2. No definite CT evidence for acute fracture or malalignment of the cervical spine. 3.  Emphysematous disease in the apices. Electronically Signed   By: Jasmine Pang M.D.   On: 05/08/2016 02:38    Procedures Procedures (including critical care time)  Medications Ordered in ED Medications  levofloxacin (LEVAQUIN) IVPB 500 mg (not administered)  sodium chloride 0.9 % bolus 1,000 mL (1,000 mLs Intravenous New Bag/Given 05/09/16 0939)  LORazepam (ATIVAN) injection 0.5 mg (0.5 mg Intravenous Given 05/09/16 1004)  ziprasidone (GEODON) injection 10 mg (10 mg Intramuscular Given 05/09/16 1030)  sterile water (preservative free) injection (1.2 mLs  Given 05/09/16 1032)     Initial Impression / Assessment and Plan / ED Course  I have reviewed the triage vital signs and the nursing notes.  Pertinent labs & imaging results that were available during my care of the patient were reviewed by me and considered in my medical decision making (see chart for details).  Clinical Course     Patient with pneumonia or confusion of hypoxia. He will be admitted to medicine and started on Levaquin Final Clinical Impressions(s) / ED Diagnoses   Final diagnoses:  None    New Prescriptions New Prescriptions   No medications on file     Bethann Berkshire, MD 05/09/16 1213

## 2016-05-09 NOTE — ED Notes (Signed)
2 sets of blood cultures were collected prior to antibiotic administration.

## 2016-05-09 NOTE — ED Notes (Signed)
Attempted blood culture blood draw without success. Phlebotomy made aware.

## 2016-05-09 NOTE — ED Notes (Signed)
Patient transported to X-ray 

## 2016-05-09 NOTE — ED Notes (Signed)
Unable to obtain vital signs. Patient combative with staff.

## 2016-05-09 NOTE — ED Notes (Signed)
Patient hitting, grabbing, kicking, scratching at staff. Patient attempting to take out his IV.

## 2016-05-09 NOTE — H&P (Addendum)
History and Physical    [name] ZOX:[id number] DOB: 1934-02-26 DOA: 05/09/2016  PCP: Ron Parker, MD  Patient coming from: [facility]  Chief Complaint: Combativeness  HPI: [name] is a 82 y.o. male with medical history significant of psychotic disorder, dementia, anxiety, hyperlipidemia. Patient presents after a recent history of being combative. Per family, this is a big step off of baseline. He was recently seen (yesterday) for dyspnea and coughing and was being treated for a pneumonia. He was discharged on azithromycin. Since then he has been getting more aggressive and using expletives.   ED Course: Vitals: Afebrile, normotensive, O2 via [place] Labs: potassium of 3.4. Hemoglobin of 11.5 Imaging: persistent evidence of infiltrate of right lower lobe with new infiltrate/atelectasis of left lower lobe Medications/Course: Patient placed in restraints and received a 1L bolus in addition to ativan and Geodon for behavior  Review of Systems: Review of Systems  Unable to perform ROS: Dementia    Past Medical History:  Diagnosis Date  . Alzheimer disease 2011  . Bronchitis   . 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 COPD 11/05/2006  . DEMENTIA 12/19/2009  . DEPRESSION 11/05/2006  . DYSLIPIDEMIA 06/17/2008  . DYSURIA, HX OF 07/29/2008  . HERNIA 10/22/2007  . HX, PERSONAL, ALCOHOLISM 11/05/2006  . HYPERTENSION 11/05/2006  . LOW BACK PAIN 11/05/2006  . TOBACCO ABUSE 06/17/2008    Past Surgical History:  Procedure Laterality Date  . ABDOMINAL AORTIC ANEURYSM REPAIR  Jan. 2003   Stent Graft  . Aortobifem  2007  . GLAUCOMA REPAIR     Left eye  . PILONIDAL CYST EXCISION       reports that he quit smoking about 7 years ago. His smoking use included Cigarettes. He has never used smokeless tobacco. He reports that he does not drink alcohol or use drugs.  Allergies  Allergen Reactions  . Sulfa Antibiotics Rash    Family History  Problem Relation Age of Onset  .  Stroke Mother   . Stroke Father     Prior to Admission medications   Medication Sig Start Date End Date Taking? Authorizing Provider  acetaminophen (TYLENOL) 500 MG tablet Take 500 mg by mouth 2 (two) times daily as needed for moderate pain.   Yes Historical Provider, MD  aspirin 81 MG chewable tablet Chew 81 mg by mouth every morning.   Yes Historical Provider, MD  azithromycin (ZITHROMAX) 250 MG tablet Take 1 tablet (250 mg total) by mouth daily. Take 2 tabs day 1, then take 1 tab day 2-5. 05/08/16  Yes Tomasita Crumble, MD  divalproex (DEPAKOTE ER) 500 MG 24 hr tablet Take 500 mg by mouth daily with breakfast.    Yes Historical Provider, MD  ENSURE (ENSURE) Take 237 mLs by mouth 3 (three) times daily between meals. *Vanilla*   Yes Historical Provider, MD  Glycerin-Hypromellose-PEG 400 (VISINE PURE TEARS) 0.2-0.2-1 % SOLN Place 1 drop into both eyes 2 (two) times daily.   Yes Historical Provider, MD  latanoprost (XALATAN) 0.005 % ophthalmic solution Place 1 drop into both eyes at bedtime.   Yes Historical Provider, MD  loperamide (IMODIUM) 2 MG capsule Take 2 mg by mouth every 6 (six) hours as needed for diarrhea or loose stools.    Yes Historical Provider, MD  loratadine (CLARITIN) 10 MG tablet Take 10 mg by mouth every morning.   Yes Historical Provider, MD  LORazepam (ATIVAN) 0.5 MG tablet Take 0.5 mg by mouth 2 (two) times daily as needed (aggitation).  Yes Historical Provider, MD  LORazepam (ATIVAN) 0.5 MG tablet Take 0.5 mg by mouth 2 (two) times daily.    Yes Historical Provider, MD  mirtazapine (REMERON) 30 MG tablet Take 30 mg by mouth at bedtime.   Yes Historical Provider, MD  Multiple Vitamin (MULTIVITAMIN WITH MINERALS) TABS tablet Take 1 tablet by mouth every morning.   Yes Historical Provider, MD  pravastatin (PRAVACHOL) 10 MG tablet Take 10 mg by mouth at bedtime.    Yes Historical Provider, MD  QUEtiapine (SEROQUEL) 50 MG tablet Take 50-100 mg by mouth 2 (two) times daily. 50mg  in  the morning and 100 mg at bedtime   Yes Historical Provider, MD  selenium sulfide (SELSUN) 1 % LOTN Apply 1 application topically 3 (three) times a week. Uses on Tuesday, Thursday, and Friday   Yes Historical Provider, MD  senna-docusate (SENOKOT-S) 8.6-50 MG tablet Take 1 tablet by mouth daily with breakfast.    Yes Historical Provider, MD  Skin Protectants, Misc. (BAZA PROTECT EX) Apply 1 application topically 3 (three) times daily as needed (apply to buttocks and sacrum).    Yes Historical Provider, MD  Skin Protectants, Misc. (MINERIN) CREA Apply 1 application topically daily.   Yes Historical Provider, MD  traMADol (ULTRAM) 50 MG tablet Take 50 mg by mouth every 6 (six) hours as needed (for pain).   Yes Historical Provider, MD  haloperidol (HALDOL) 2 MG tablet Take 1 tablet (2 mg total) by mouth 2 (two) times daily. Patient not taking: Reported on 05/09/2016 04/19/15   Alison Murray, MD    Physical Exam: Vitals:   05/09/16 1300 05/09/16 1354 05/09/16 1800 05/09/16 1956  BP: 137/72 138/75 (!) 155/73 (!) 142/44  Pulse: 90 86 88 60  Resp: 20 18 17 18   Temp:   98.2 F (36.8 C) 97.7 F (36.5 C)  TempSrc:    Axillary  SpO2: 94% 94% 100% 96%  Weight:      Height:         Constitutional: NAD, calm, comfortable Eyes: PERRL, lids and conjunctivae normal ENMT: Mucous membranes are moist Neck: normal, supple, no masses, no thyromegaly Respiratory: clear to auscultation bilaterally on anterior auscultation. Normal respiratory effort. No accessory muscle use.  Cardiovascular: Regular rate and rhythm, no murmurs / rubs / gallops. No extremity edema. 2+ pedal pulses.   Abdomen: no tenderness, no masses palpated. Bowel sounds positive.  Musculoskeletal: no clubbing / cyanosis. No joint deformity upper and lower extremities. Good ROM, no contractures. Normal muscle tone.  Skin: no rashes, lesions, ulcers. No induration Neurologic: moves extremities spontaneously  Psychiatric: Alert. Not  oriented.   Labs on Admission: I have personally reviewed following labs and imaging studies  CBC:  Recent Labs Lab 05/09/16 0935 05/09/16 1637  WBC 4.8 4.0  NEUTROABS 3.4  --   HGB 11.5* 11.3*  HCT 35.2* 34.5*  MCV 95.1 94.0  PLT 192 199   Basic Metabolic Panel:  Recent Labs Lab 05/09/16 0935 05/09/16 1637  NA 142  --   K 3.4*  --   CL 107  --   CO2 29  --   GLUCOSE 127*  --   BUN 23*  --   CREATININE 1.06 0.78  CALCIUM 9.0  --    GFR: Estimated Creatinine Clearance: 53.4 mL/min (by C-G formula based on SCr of 0.78 mg/dL). Liver Function Tests:  Recent Labs Lab 05/09/16 0935  AST 20  ALT 16*  ALKPHOS 60  BILITOT 0.7  PROT 7.1  ALBUMIN  3.6   No results for input(s): LIPASE, AMYLASE in the last 168 hours.  Recent Labs Lab 05/09/16 1637  AMMONIA 10   Coagulation Profile: No results for input(s): INR, PROTIME in the last 168 hours. Cardiac Enzymes:  Recent Labs Lab 05/09/16 1637  TROPONINI <0.03   BNP (last 3 results) No results for input(s): PROBNP in the last 8760 hours. HbA1C: No results for input(s): HGBA1C in the last 72 hours. CBG: No results for input(s): GLUCAP in the last 168 hours. Lipid Profile: No results for input(s): CHOL, HDL, LDLCALC, TRIG, CHOLHDL, LDLDIRECT in the last 72 hours. Thyroid Function Tests: No results for input(s): TSH, T4TOTAL, FREET4, T3FREE, THYROIDAB in the last 72 hours. Anemia Panel: No results for input(s): VITAMINB12, FOLATE, FERRITIN, TIBC, IRON, RETICCTPCT in the last 72 hours. Urine analysis:    Component Value Date/Time   COLORURINE AMBER (A) 05/09/2016 0950   APPEARANCEUR CLEAR 05/09/2016 0950   LABSPEC 1.029 05/09/2016 0950   PHURINE 5.5 05/09/2016 0950   GLUCOSEU NEGATIVE 05/09/2016 0950   HGBUR NEGATIVE 05/09/2016 0950   HGBUR trace-lysed 08/24/2008 1049   BILIRUBINUR SMALL (A) 05/09/2016 0950   KETONESUR NEGATIVE 05/09/2016 0950   PROTEINUR 30 (A) 05/09/2016 0950   UROBILINOGEN 1.0  04/16/2015 0055   NITRITE NEGATIVE 05/09/2016 0950   LEUKOCYTESUR NEGATIVE 05/09/2016 0950   Sepsis Labs: !!!!!!!!!!!!!!!!!!!!!!!!!!!!!!!!!!!!!!!!!!!! @LABRCNTIP (procalcitonin:4,lacticidven:4) ) Recent Results (from the past 240 hour(s))  MRSA PCR Screening     Status: None   Collection Time: 05/09/16  6:15 PM  Result Value Ref Range Status   MRSA by PCR NEGATIVE NEGATIVE Final    Comment:        The GeneXpert MRSA Assay (FDA approved for NASAL specimens only), is one component of a comprehensive MRSA colonization surveillance program. It is not intended to diagnose MRSA infection nor to guide or monitor treatment for MRSA infections.      Radiological Exams on Admission: Dg Chest 2 View  Result Date: 05/09/2016 CLINICAL DATA:  Altered mental status, cough, recent pneumonia EXAM: CHEST  2 VIEW COMPARISON:  05/08/2016 FINDINGS: Persistent streaky infiltrate/ pneumonia right lower lobe infrahilar. New streaky infiltrate or atelectasis left lower lobe. No pulmonary edema. Degenerative changes thoracic spine. There are compression deformities lower thoracic spine of indeterminate age. Clinical correlation is necessary. IMPRESSION: Persistent streaky infiltrate/ pneumonia right lower lobe infrahilar. New streaky infiltrate or atelectasis left lower lobe. Followup PA and lateral chest X-ray is recommended in 3-4 weeks following trial of antibiotic therapy to ensure resolution and exclude underlying malignancy. Compression deformities lower thoracic spine of indeterminate age. Clinical correlation is necessary. Electronically Signed   By: Natasha MeadLiviu  Pop M.D.   On: 05/09/2016 09:35   Dg Chest 2 View  Result Date: 05/08/2016 CLINICAL DATA:  Unwitnessed fall at nursing home cough EXAM: CHEST  2 VIEW COMPARISON:  06/22/2014, FINDINGS: Mildly low lung volumes. Consolidative opacity at the right middle lobe and right lung base. No effusion. Heart size normal. Atherosclerosis. No pneumothorax.  IMPRESSION: Focal opacity at the right infrahilar lung, could reflect pneumonia although a mass is not excluded. Radiographic follow-up to resolution recommended, if no resolution, CT follow-up would be advised. Electronically Signed   By: Jasmine PangKim  Fujinaga M.D.   On: 05/08/2016 02:02   Ct Head Wo Contrast  Result Date: 05/08/2016 CLINICAL DATA:  Unwitnessed fall injury to top of right side of head dementia EXAM: CT HEAD WITHOUT CONTRAST CT CERVICAL SPINE WITHOUT CONTRAST TECHNIQUE: Multidetector CT imaging of the head and cervical spine  was performed following the standard protocol without intravenous contrast. Multiplanar CT image reconstructions of the cervical spine were also generated. COMPARISON:  10/04/2005 FINDINGS: CT HEAD FINDINGS Brain: No acute territorial infarction, intracranial hemorrhage or focal mass lesion is visualized. There is no midline shift. Mild to moderate ventricular enlargement, increased compared to prior but likely related to atrophy. There is moderate atrophy which has progressed since the previous exam. There are mild white matter hypodensities, felt consistent with small vessel disease. Vascular: Carotid artery calcifications. Mild increased density in the region of left MCA on axial views but on coronal views, there appears to be mild dense linear artifact in this region. Skull: Mastoid air cells are clear.  There is no skull fracture. Sinuses/Orbits: Mucosal thickening and retention cysts in the maxillary, ethmoid and frontal sinuses. Old appearing fracture of the medial wall of the right orbit. No acute orbital abnormality. Other: None CT CERVICAL SPINE FINDINGS Alignment: No subluxation is seen. Facet alignment maintained. Appearance at C1-C2 is thought related to head positioning. Skull base and vertebrae: Craniovertebral junction appears intact. Vertebral body heights are maintained. There is no fracture identified. Soft tissues and spinal canal: No prevertebral fluid or  swelling. No visible canal hematoma. Disc levels: Moderate narrowing, endplate changes and osteophyte at C5-C6 and C6-C7. Mild to moderate canal stenosis at C5-C6 and C6-C7. Posterior disc osteophyte complex at C6-C7. Multilevel hyper trophic facet arthropathy results in multilevel foraminal stenosis. Upper chest: Emphysematous disease in the apices. Other: Thyroid gland is within normal limits. Carotid artery calcifications. IMPRESSION: 1. No definite CT evidence for acute intracranial abnormality. Mild periventricular white matter small vessel disease. Progression of atrophy. 2. No definite CT evidence for acute fracture or malalignment of the cervical spine. 3. Emphysematous disease in the apices. Electronically Signed   By: Jasmine PangKim  Fujinaga M.D.   On: 05/08/2016 02:38   Ct Cervical Spine Wo Contrast  Result Date: 05/08/2016 CLINICAL DATA:  Unwitnessed fall injury to top of right side of head dementia EXAM: CT HEAD WITHOUT CONTRAST CT CERVICAL SPINE WITHOUT CONTRAST TECHNIQUE: Multidetector CT imaging of the head and cervical spine was performed following the standard protocol without intravenous contrast. Multiplanar CT image reconstructions of the cervical spine were also generated. COMPARISON:  10/04/2005 FINDINGS: CT HEAD FINDINGS Brain: No acute territorial infarction, intracranial hemorrhage or focal mass lesion is visualized. There is no midline shift. Mild to moderate ventricular enlargement, increased compared to prior but likely related to atrophy. There is moderate atrophy which has progressed since the previous exam. There are mild white matter hypodensities, felt consistent with small vessel disease. Vascular: Carotid artery calcifications. Mild increased density in the region of left MCA on axial views but on coronal views, there appears to be mild dense linear artifact in this region. Skull: Mastoid air cells are clear.  There is no skull fracture. Sinuses/Orbits: Mucosal thickening and retention  cysts in the maxillary, ethmoid and frontal sinuses. Old appearing fracture of the medial wall of the right orbit. No acute orbital abnormality. Other: None CT CERVICAL SPINE FINDINGS Alignment: No subluxation is seen. Facet alignment maintained. Appearance at C1-C2 is thought related to head positioning. Skull base and vertebrae: Craniovertebral junction appears intact. Vertebral body heights are maintained. There is no fracture identified. Soft tissues and spinal canal: No prevertebral fluid or swelling. No visible canal hematoma. Disc levels: Moderate narrowing, endplate changes and osteophyte at C5-C6 and C6-C7. Mild to moderate canal stenosis at C5-C6 and C6-C7. Posterior disc osteophyte complex at C6-C7. Multilevel hyper  trophic facet arthropathy results in multilevel foraminal stenosis. Upper chest: Emphysematous disease in the apices. Other: Thyroid gland is within normal limits. Carotid artery calcifications. IMPRESSION: 1. No definite CT evidence for acute intracranial abnormality. Mild periventricular white matter small vessel disease. Progression of atrophy. 2. No definite CT evidence for acute fracture or malalignment of the cervical spine. 3. Emphysematous disease in the apices. Electronically Signed   By: Jasmine Pang M.D.   On: 05/08/2016 02:38    EKG: Independently reviewed. Sinus  Assessment/Plan Active Problems:   Pneumonia   HCAP Acute respiratory failure secondary to pneumonia Patient is from a SNF. He was diagnosed with pneumonia on previous ED visit yesterday and is now increasingly altered. Concern for possible aspiration -levaquin per pharmacy -follow-up blood cultures -SLP eval -repeat chest x-ray in 3-4 weeks to ensure resolution and no evidence of malignancy  Dementia Delirium History of dementia but likely made worse secondary to acute infection. VBG wnl. BUN only slightly elevated at 23. Ammonia of 10. Seems he has had episodes like this in the past and he is  currently on antipsychotics for history of psychosis -restraints while patient is combative, reassess in AM -haldol prn; discontinue as early as possible  History of psychosis Treated with antipsychotics as outpatient -held outpatient medications as patient is NPO pending speech evaluation -haldol prn  Compression fractures Seen on chest x-ray -follow-up outpatient   DVT prophylaxis: Lovenox Code Status: Full code Family Communication: Daughter and wife at bedside Disposition Plan: Discharge pending clinical improvement Consults called: None Admission status: Inpatient, medical floor   Jacquelin Hawking, MD [facility] Pager [phone]  If 7PM-7AM, please contact night-coverage [email or web address] Password [email or web address]  05/09/2016, 11:09 PM

## 2016-05-09 NOTE — ED Notes (Addendum)
Upon assessment of patient, patient is calm and cooperative. Patient is not voicing any threats or displaying aggressive behavior.
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
## 2016-05-09 NOTE — ED Notes (Signed)
Family concerned about patient having a stroke. States they think he mouth looks slightly asymmetrical. MD made aware and states he will come assess the patient.

## 2016-05-09 NOTE — Progress Notes (Addendum)
Pharmacy Antibiotic Note  [name] is a 82 y.o. male admitted on 05/09/2016 with pneumonia.  Patient was diagnosed with pneumonia and started on azithromycin on 11/28.  Pharmacy is now consulted for Levaquin dosing.  Has received a dose of 500mg  IV in the ED at 13:13.  Plan:  Continue Levaquin 750mg  IV q48h - next dose 11/30  Follow up renal function & cultures  Height: 5\' 9"  (175.3 cm) Weight: 113 lb (51.3 kg) IBW/kg (Calculated) : 70.7  Temp (24hrs), Avg:98.1 F (36.7 C), Min:98.1 F (36.7 C), Max:98.1 F (36.7 C)   Recent Labs Lab 05/09/16 0935  WBC 4.8  CREATININE 1.06    Estimated Creatinine Clearance: 40.3 mL/min (by C-G formula based on SCr of 1.06 mg/dL).    Allergies  Allergen Reactions  . Sulfa Antibiotics Rash    Antimicrobials this admission: 11/29 Levaquin >>  Microbiology results: 11/29 BCx: sent  Thank you for allowing pharmacy to be a part of this patient's care.  Loralee PacasErin Jecenia Leamer, PharmD, BCPS Pager: [phone] 05/09/2016 4:04 PM   Addendum: SCr improved to 0.78 and CrCl 53 ml/min, therefore, will increase Levaquin to 750mg  q24h.  Loralee PacasErin Gurbani Figge, PharmD, BCPS 05/09/2016 8:50 PM

## 2016-05-09 NOTE — ED Notes (Signed)
ED Provider at bedside. 

## 2016-05-09 NOTE — ED Notes (Signed)
Hospitalist at bedside 

## 2016-05-10 DIAGNOSIS — I10 Essential (primary) hypertension: Secondary | ICD-10-CM

## 2016-05-10 DIAGNOSIS — J181 Lobar pneumonia, unspecified organism: Secondary | ICD-10-CM

## 2016-05-10 DIAGNOSIS — F0391 Unspecified dementia with behavioral disturbance: Secondary | ICD-10-CM

## 2016-05-10 LAB — BASIC METABOLIC PANEL
ANION GAP: 5 (ref 5–15)
BUN: 13 mg/dL (ref 6–20)
CHLORIDE: 107 mmol/L (ref 101–111)
CO2: 27 mmol/L (ref 22–32)
Calcium: 8.3 mg/dL — ABNORMAL LOW (ref 8.9–10.3)
Creatinine, Ser: 0.69 mg/dL (ref 0.61–1.24)
GFR calc Af Amer: >60 mL/min (ref >60)
Glucose, Bld: 115 mg/dL — ABNORMAL HIGH (ref 65–99)
POTASSIUM: 3.4 mmol/L — AB (ref 3.5–5.1)
SODIUM: 139 mmol/L (ref 135–145)

## 2016-05-10 LAB — CBC
HEMATOCRIT: 34.7 % — AB (ref 39.0–52.0)
HEMOGLOBIN: 11.7 g/dL — AB (ref 13.0–17.0)
MCH: 31.4 pg (ref 26.0–34.0)
MCHC: 33.7 g/dL (ref 30.0–36.0)
MCV: 93 fL (ref 78.0–100.0)
Platelets: 218 10*3/uL (ref 150–400)
RBC: 3.73 MIL/uL — AB (ref 4.22–5.81)
RDW: 14.3 % (ref 11.5–15.5)
WBC: 3.9 10*3/uL — AB (ref 4.0–10.5)

## 2016-05-10 MED ORDER — MORPHINE SULFATE (PF) 2 MG/ML IV SOLN
1.0000 mg | INTRAVENOUS | Status: DC | PRN
Start: 2016-05-10 — End: 2016-05-15
  Administered 2016-05-10 – 2016-05-11 (×2): 1 mg via INTRAVENOUS
  Filled 2016-05-10 (×2): qty 1

## 2016-05-10 MED ORDER — POTASSIUM CHLORIDE CRYS ER 20 MEQ PO TBCR
40.0000 meq | EXTENDED_RELEASE_TABLET | Freq: Once | ORAL | Status: AC
  Administered 2016-05-10: 40 meq via ORAL
  Filled 2016-05-10: qty 2

## 2016-05-10 NOTE — Evaluation (Signed)
Physical Therapy Evaluation Patient Details Name: Riley ClaudeRalph A Rumsey MRN: [id number] DOB: [birth year] Today's Date: 05/10/2016   History of Present Illness  [name] is a 82 y.o. male with medical history significant of psychotic disorder, dementia, anxiety, hyperlipidemia. Patient presents after a recent history of being combative. He was recently  being treated for a pneumonia.  He has been getting more aggressive and using expletives  Clinical Impression  With much care, PT assisted  Patient to sitting and standing at the bed edge. It appears that he may be able to ambulate but unable due to patient becoming more agitated. Assisted back to bed. Pt admitted with above diagnosis. Pt currently with functional limitations due to the deficits listed below (see PT Problem List).  Pt will benefit from skilled PT to increase their independence and safety with mobility to allow discharge to the venue listed below.       Follow Up Recommendations SNF;Supervision/Assistance - 24 hour- DC plan is not known, no family present.    Equipment Recommendations  None recommended by PT    Recommendations for Other Services       Precautions / Restrictions Precautions Precautions: Fall Precaution Comments: can be combative      Mobility  Bed Mobility Overal bed mobility: Needs Assistance Bed Mobility: Supine to Sit;Sit to Supine     Supine to sit: Mod assist Sit to supine: Mod assist   General bed mobility comments: hand over hand guidance to sit up , assist to get back into bed  Transfers Overall transfer level: Needs assistance Equipment used: Rolling walker (2 wheeled);1 person hand held assist Transfers: Sit to/from Stand Sit to Stand: Mod assist         [facility] . 2 sidesteps along bed, was not holding onto RW,  Ambulation/Gait                Stairs            Wheelchair Mobility    Modified Rankin (Stroke  Patients Only)       Balance Overall balance assessment: Needs assistance Sitting-balance support: Feet supported;No upper extremity supported Sitting balance-Leahy Scale: Fair     Standing balance support: During functional activity;Bilateral upper extremity supported Standing balance-Leahy Scale: Poor                               Pertinent Vitals/Pain Pain Assessment: Faces Faces Pain Scale: No hurt    Home Living Family/patient expects to be discharged to:: Skilled nursing facility                 Additional Comments: has been  at memory care./ ALF vs SNF, no family present.    Prior Function           Comments: unable to obtain PLOF     Hand Dominance        Extremity/Trunk Assessment   Upper Extremity Assessment: Generalized weakness           Lower Extremity Assessment: Generalized weakness      Cervical / Trunk Assessment: Kyphotic  Communication   Communication: Expressive difficulties  Cognition Arousal/Alertness: Awake/alert Behavior During Therapy: Restless Overall Cognitive Status: History of cognitive impairments - at baseline Area of Impairment: Orientation;Awareness                    General Comments  Exercises     Assessment/Plan    PT Assessment Patient needs continued PT services  PT Problem List Decreased activity tolerance;Decreased balance;Decreased mobility;Decreased cognition;Decreased safety awareness;Decreased knowledge of precautions          PT Treatment Interventions DME instruction;Gait training;Functional mobility training;Therapeutic activities;Patient/family education    PT Goals (Current goals can be found in the Care Plan section)  Acute Rehab PT Goals PT Goal Formulation: Patient unable to participate in goal setting Time For Goal Achievement: 05/24/16 Potential to Achieve Goals: Fair    Frequency Min 2X/week   Barriers to discharge        Co-evaluation                End of Session   Activity Tolerance: Patient tolerated treatment well Patient left: in bed;with call bell/phone within reach;with bed alarm set;with restraints reapplied Nurse Communication: Mobility status         Time: [id number] PT Time Calculation (min) (ACUTE ONLY): 19 min   Charges:   PT Evaluation $PT Eval Low Complexity: 1 Procedure     PT G CodesRada Hay:        Savannha Welle Elizabeth 05/10/2016, 4:18 PM  Blanchard KelchKaren Sharla Tankard PT [phone]

## 2016-05-10 NOTE — Progress Notes (Signed)
Patient became very agitated. Trying to take out IV and pulling tubes. Unable to redirect patient. Patient was unable to follow commands. Talked with family; spouse said she will try to come. Received orders for initiate soft non violent restraints. Will continue to monitor. Spouse notified.

## 2016-05-10 NOTE — Progress Notes (Signed)
Patient ID: [name], male   DOB: 02-Feb-1934, 82 y.o.   MRN: [id number]    PROGRESS NOTE   [name]  WUJ:[id number] DOB: 02-Feb-1934 DOA: 05/09/2016  PCP: Ron ParkerBOWEN,SAMUEL, MD   Brief Narrative:  82 y.o. male with medical history significant of psychotic disorder, dementia, anxiety, hyperlipidemia. Patient presented after a recent history of being combative. Per family, this is a big step off of baseline. He was recently seen (yesterday) for dyspnea and coughing and was being treated for a pneumonia. He was treated with azithromycin but he did not seem to be improving.   Assessment & Plan: Acute respiratory failure secondary to pneumonia, HCAP - Patient is from a SNF. He was diagnosed with pneumonia on previous ED visit and was increasingly more altered  - Concern for possible aspiration - Levaquin started per pharmacy, continue same ABX for now day #2 - follow-up blood cultures - SLP eval pending  - pt will need repeat chest x-ray in 3-4 weeks to ensure resolution and no evidence of malignancy  Dementia, Delirium - History of dementia but likely made worse secondary to acute infection - OK to remove restraints as pt more calm this AM  - ok to use haldol prn  History of psychosis - Treated with antipsychotics as outpatient - hold outpatient medications as patient is NPO pending speech evaluation  Compression fractures - Seen on chest x-ray - follow-up outpatient - PT eval requested   Hypokalemia - mild, supplement, BMP In AM  DVT prophylaxis: Lovenox SQ Code Status: Full  Family Communication: Patient at bedside, no family at bedside this AM Disposition Plan: Back to SNF when medically ready, PT eval also requested   Consultants:   None  Procedures:   None  Antimicrobials:   Levaquin 11/29 -->  Subjective: No events overnight.   Objective: Vitals:   05/09/16 1354 05/09/16 1800 05/09/16 1956 05/10/16 0515  BP: 138/75 (!) 155/73 (!) 142/44 (!)  152/80  Pulse: 86 88 60 89  Resp: 18 17 18 18   Temp:  98.2 F (36.8 C) 97.7 F (36.5 C) 98.7 F (37.1 C)  TempSrc:   Axillary Oral  SpO2: 94% 100% 96% 98%  Weight:      Height:        Intake/Output Summary (Last 24 hours) at 05/10/16 0959 Last data filed at 05/10/16 0934  Gross per 24 hour  Intake          1618.33 ml  Output              700 ml  Net           918.33 ml   Filed Weights   05/09/16 0827  Weight: 51.3 kg (113 lb)    Examination:  General exam: Appears calm and comfortable  Respiratory system: Respiratory effort normal. Rhonchi at bases  Cardiovascular system: S1 & S2 heard, RRR. No JVD, rubs, gallops or clicks. No pedal edema. Gastrointestinal system: Abdomen is nondistended, soft and nontender. No organomegaly or masses felt.   Data Reviewed: I have personally reviewed following labs and imaging studies  CBC:  Recent Labs Lab 05/09/16 0935 05/09/16 1637 05/10/16 0610  WBC 4.8 4.0 3.9*  NEUTROABS 3.4  --   --   HGB 11.5* 11.3* 11.7*  HCT 35.2* 34.5* 34.7*  MCV 95.1 94.0 93.0  PLT 192 199 218   Basic Metabolic Panel:  Recent Labs Lab 05/09/16 0935 05/09/16 1637 05/10/16 0610  NA 142  --  139  K  3.4*  --  3.4*  CL 107  --  107  CO2 29  --  27  GLUCOSE 127*  --  115*  BUN 23*  --  13  CREATININE 1.06 0.78 0.69  CALCIUM 9.0  --  8.3*   Liver Function Tests:  Recent Labs Lab 05/09/16 0935  AST 20  ALT 16*  ALKPHOS 60  BILITOT 0.7  PROT 7.1  ALBUMIN 3.6    Recent Labs Lab 05/09/16 1637  AMMONIA 10   Cardiac Enzymes:  Recent Labs Lab 05/09/16 1637  TROPONINI <0.03   Urine analysis:    Component Value Date/Time   COLORURINE AMBER (A) 05/09/2016 0950   APPEARANCEUR CLEAR 05/09/2016 0950   LABSPEC 1.029 05/09/2016 0950   PHURINE 5.5 05/09/2016 0950   GLUCOSEU NEGATIVE 05/09/2016 0950   HGBUR NEGATIVE 05/09/2016 0950   HGBUR trace-lysed 08/24/2008 1049   BILIRUBINUR SMALL (A) 05/09/2016 0950   KETONESUR NEGATIVE  05/09/2016 0950   PROTEINUR 30 (A) 05/09/2016 0950   UROBILINOGEN 1.0 04/16/2015 0055   NITRITE NEGATIVE 05/09/2016 0950   LEUKOCYTESUR NEGATIVE 05/09/2016 0950    Recent Results (from the past 240 hour(s))  MRSA PCR Screening     Status: None   Collection Time: 05/09/16  6:15 PM  Result Value Ref Range Status   MRSA by PCR NEGATIVE NEGATIVE Final     Radiology Studies: Dg Chest 2 View Result Date: 05/09/2016 Persistent streaky infiltrate/ pneumonia right lower lobe infrahilar. New streaky infiltrate or atelectasis left lower lobe. Followup PA and lateral chest X-ray is recommended in 3-4 weeks following trial of antibiotic therapy to ensure resolution and exclude underlying malignancy. Compression deformities lower thoracic spine of indeterminate age. Clinical correlation is necessary.  Scheduled Meds: . enoxaparin (LOVENOX) injection  40 mg Subcutaneous Q24H  . levofloxacin (LEVAQUIN) IV  750 mg Intravenous Q24H   Continuous Infusions: . dextrose 5 % and 0.45% NaCl 100 mL/hr at 05/10/16 0939    LOS: 1 day   Time spent: 20 minutes   Debbora PrestoMAGICK-Latish Toutant, MD [facility] Pager [phone]  If 7PM-7AM, please contact night-coverage [email or web address] Password [email or web address] 05/10/2016, 9:59 AM

## 2016-05-10 NOTE — Progress Notes (Addendum)
BSE completed, full report to follow.  Pt with mild dysphagia, impulsivity and takes large boluses with cough x1 of approximately 25 swallows.  Suspect pt cough was due to premature spillage of boluses into pharynx.  Pt is edentulous and thus recommend soft/thin diet with precautions.  Educated wife to recommendations and compensation strategies.  Will follow up briefly to assure tolerance.  Riley Burnetamara Ishika [place], MS [facility] SLP   [phone]

## 2016-05-10 NOTE — Evaluation (Signed)
Clinical/Bedside Swallow Evaluation Patient Details  Name: [name] MRN: [id number] Date of Birth: 1933-08-07  Today's Date: 05/10/2016 Time: SLP Start Time (ACUTE ONLY): 1150 SLP Stop Time (ACUTE ONLY): 1224 SLP Time Calculation (min) (ACUTE ONLY): 34 min  Past Medical History:  Past Medical History:  Diagnosis Date  . Alzheimer disease 2011  . Bronchitis   . 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 COPD 11/05/2006  . DEMENTIA 12/19/2009  . DEPRESSION 11/05/2006  . DYSLIPIDEMIA 06/17/2008  . DYSURIA, HX OF 07/29/2008  . HERNIA 10/22/2007  . HX, PERSONAL, ALCOHOLISM 11/05/2006  . HYPERTENSION 11/05/2006  . LOW BACK PAIN 11/05/2006  . TOBACCO ABUSE 06/17/2008   Past Surgical History:  Past Surgical History:  Procedure Laterality Date  . ABDOMINAL AORTIC ANEURYSM REPAIR  Jan. 2003   Stent Graft  . Aortobifem  2007  . GLAUCOMA REPAIR     Left eye  . PILONIDAL CYST EXCISION     HPI:  82 yo male with  h/o dementia who stays at a facility [facility] after [facility] and found to have pna.  Swallow evaluation ordered.  Per imaging study, pt with bilateral lower lobe pna.  Wife present and reports she does not know for certain if pt has problems swallowing as he will not eat when she visits him. She states he does not recognize her all the time now.    Assessment / Plan / Recommendation Clinical Impression  Pt with mild dysphagia, impulsivity and takes large boluses with cough x1 of approximately 25 swallows.  Suspect pt cough was due to premature spillage of boluses into pharynx.  Pt is edentulous and thus recommend soft/thin diet with precautions.  Educated wife to recommendations and compensation strategies.  Will follow up briefly to assure tolerance.    Aspiration Risk  Mild aspiration risk;Moderate aspiration risk    Diet Recommendation Dysphagia 3 (Mech soft);Thin liquid   Liquid Administration via: Cup;Straw Medication Administration: Whole meds with puree Supervision:  Patient able to self feed;Staff to assist with self feeding Compensations: Small sips/bites;Slow rate Postural Changes: Seated upright at 90 degrees    Other  Recommendations Oral Care Recommendations: Oral care BID   Follow up Recommendations None      Frequency and Duration min 1 x/week  1 week       Prognosis Prognosis for Safe Diet Advancement: Good Barriers to Reach Goals: Cognitive deficits      Swallow Study   General Date of Onset: 05/10/16 HPI: 82 yo male with  h/o dementia who stays at a facility [facility] after [facility] and found to have pna.  Swallow evaluation ordered.  Per imaging study, pt with bilateral lower lobe pna.  Wife present and reports she does not know for certain if pt has problems swallowing as he will not eat when she visits him. She states he does not recognize her all the time now.  Type of Study: Bedside Swallow Evaluation Diet Prior to this Study: NPO Temperature Spikes Noted: No Respiratory Status: Room air History of Recent Intubation: No Behavior/Cognition: Confused;Distractible;Doesn't follow directions Oral Cavity Assessment:  (only viewed anterior oral cavity, was clear) Oral Care Completed by SLP: No Oral Cavity - Dentition: Edentulous Vision: Functional for self-feeding Self-Feeding Abilities: Able to feed self Patient Positioning: Upright in bed Baseline Vocal Quality: Normal Volitional Cough: Cognitively unable to elicit Volitional Swallow: Unable to elicit    Oral/Motor/Sensory Function Overall Oral Motor/Sensory Function: Within functional limits (pt did not follow directions but able  to seal lips on straw/cup/spoon)   Ice Chips Ice chips: Not tested   Thin Liquid Thin Liquid: Within functional limits Presentation: Straw;Self Fed;Cup    Nectar Thick Nectar Thick Liquid: Not tested   Honey Thick Honey Thick Liquid: Not tested   Puree Puree: Within functional limits Presentation: Self Fed;Spoon   Solid   GO    Riley Burnetamara  Angelos Wasco, MS [facility] SLP [phone]  Solid: Impaired Presentation: Self Fed Oral Phase Impairments: Reduced lingual movement/coordination;Impaired mastication Pharyngeal Phase Impairments: Suspected delayed Swallow        [name], [name] Ann 05/10/2016,3:18 PM

## 2016-05-11 ENCOUNTER — Inpatient Hospital Stay (HOSPITAL_COMMUNITY): Payer: Medicare Other

## 2016-05-11 LAB — CBC
HEMATOCRIT: 38.9 % — AB (ref 39.0–52.0)
HEMOGLOBIN: 13.2 g/dL (ref 13.0–17.0)
MCH: 31.1 pg (ref 26.0–34.0)
MCHC: 33.9 g/dL (ref 30.0–36.0)
MCV: 91.5 fL (ref 78.0–100.0)
Platelets: 257 10*3/uL (ref 150–400)
RBC: 4.25 MIL/uL (ref 4.22–5.81)
RDW: 14.2 % (ref 11.5–15.5)
WBC: 5.3 10*3/uL (ref 4.0–10.5)

## 2016-05-11 LAB — BASIC METABOLIC PANEL
ANION GAP: 12 (ref 5–15)
BUN: 8 mg/dL (ref 6–20)
CHLORIDE: 104 mmol/L (ref 101–111)
CO2: 22 mmol/L (ref 22–32)
Calcium: 8.5 mg/dL — ABNORMAL LOW (ref 8.9–10.3)
Creatinine, Ser: 0.73 mg/dL (ref 0.61–1.24)
GFR calc Af Amer: >60 mL/min (ref >60)
GFR calc non Af Amer: >60 mL/min (ref >60)
GLUCOSE: 110 mg/dL — AB (ref 65–99)
POTASSIUM: 4.5 mmol/L (ref 3.5–5.1)
Sodium: 138 mmol/L (ref 135–145)

## 2016-05-11 MED ORDER — IOPAMIDOL (ISOVUE-300) INJECTION 61%
100.0000 mL | Freq: Once | INTRAVENOUS | Status: AC | PRN
Start: 2016-05-11 — End: 2016-05-11
  Administered 2016-05-11: 100 mL via INTRAVENOUS

## 2016-05-11 MED ORDER — SODIUM CHLORIDE 0.9 % IJ SOLN
INTRAMUSCULAR | Status: AC
  Filled 2016-05-11: qty 50

## 2016-05-11 MED ORDER — IOPAMIDOL (ISOVUE-300) INJECTION 61%
INTRAVENOUS | Status: AC
  Filled 2016-05-11: qty 100

## 2016-05-11 MED ORDER — IOPAMIDOL (ISOVUE-300) INJECTION 61%
INTRAVENOUS | Status: AC
  Filled 2016-05-11: qty 30

## 2016-05-11 NOTE — Progress Notes (Signed)
Pharmacy Antibiotic Note  [name] is a 82 y.o. male admitted on 05/09/2016 with pneumonia.  Patient was diagnosed with pneumonia and started on azithromycin on 11/28.  Pharmacy is now consulted for Levaquin dosing.  Has received a dose of 500mg  IV in the ED at 13:13.  Plan: Day #3 Antibiotics  Continue Levaquin 750mg  IV q24h for CrCl > 50 ml/min  Recommend switching to PO as soon as clinically possible  Follow up renal function & cultures  Height: 5\' 9"  (175.3 cm) Weight: 113 lb (51.3 kg) IBW/kg (Calculated) : 70.7  Temp (24hrs), Avg:98.5 F (36.9 C), Min:98.4 F (36.9 C), Max:98.5 F (36.9 C)   Recent Labs Lab 05/09/16 0935 05/09/16 1637 05/10/16 0610 05/11/16 0510  WBC 4.8 4.0 3.9* 5.3  CREATININE 1.06 0.78 0.69 0.73    Estimated Creatinine Clearance: 53.4 mL/min (by C-G formula based on SCr of 0.73 mg/dL).    Allergies  Allergen Reactions  . Sulfa Antibiotics Rash    Antimicrobials this admission: 11/29 Levaquin >>   Microbiology results: 11/29 BCx: sent  Thank you for allowing pharmacy to be a part of this patient's care.   Hessie KnowsJustin M Kejon Feild, PharmD, BCPS Pager [phone] 05/11/2016 8:20 AM

## 2016-05-11 NOTE — Progress Notes (Signed)
Speech Language Pathology Treatment: Dysphagia  Patient Details Name: [name] MRN: [id number] DOB: 08-Jun-1934 Today's Date: 05/11/2016 Time: [id number] SLP Time Calculation (min) (ACUTE ONLY): 30 min  Assessment / Plan / Recommendation Clinical Impression  Per RN, pt did not consume much breakfast today.  SLP took pt's restraint off of his right hand to allow him to self feed _-container of Ensure with icecream, 4 graham crackers and single bolus of soda.  NO indication of airway compromise and pt consume po slowly.  Pt did repeatedly state "I am going home now".   As pt is edentulous, rec continue dys3/thin - please allow pt to self feed with right hand for improved intake/airway protection.  SLP to sign off, please reorder if indicated.  Thanks   HPI HPI: 82 yo male with  h/o dementia who [facility] and found to have [facility].  Swallow evaluation ordered.  Per imaging study, pt with bilateral lower lobe pna.  Wife present and reports she does not know for certain if pt has problems swallowing as he will not eat when she visits him. She states he does not recognize her all the time now.       SLP Plan  Continue with current plan of care     Recommendations  Diet recommendations: Thin liquid;Dysphagia 3 (mechanical soft) Liquids provided via: Straw;Cup Medication Administration: Whole meds with puree Compensations: Small sips/bites;Slow rate Postural Changes and/or Swallow Maneuvers: Seated upright 90 degrees;Upright 30-60 min after meal                Oral Care Recommendations: Oral care BID Follow up Recommendations: None Plan: Continue with current plan of care       GO                Donavan Burnetamara Tharon Kitch, MS [facility] SLP [phone]

## 2016-05-11 NOTE — Progress Notes (Signed)
Patient ID: [name], male   DOB: 09-16-1933, 82 y.o.   MRN: [id number]    PROGRESS NOTE   [name]  [id number] DOB: 09-16-1933 DOA: 05/09/2016  PCP: Ron ParkerBOWEN,SAMUEL, MD   Brief Narrative:  82 y.o. male with medical history significant of psychotic disorder, dementia, anxiety, hyperlipidemia. Patient presented after a recent history of being combative. Per family, this is a big step off of baseline. He was recently seen (yesterday) for dyspnea and coughing and was being treated for a pneumonia. He was treated with azithromycin but he did not seem to be improving.   Assessment & Plan: Acute respiratory failure secondary to pneumonia, HCAP - Patient is from a SNF. He was diagnosed with pneumonia on previous ED visit and was increasingly more altered  - Concern for possible aspiration - Levaquin started per pharmacy, continue same ABX for now day #3 - follow-up blood cultures - SLP eval done, dys III diet recmmended - pt will need repeat chest x-ray in 3-4 weeks to ensure resolution and no evidence of malignancy  Dementia, Delirium - History of dementia but likely made worse secondary to acute infection - still with intermittent episodes of confusion requiring intermittent restraints  - ok to use haldol prn  History of psychosis - Treated with antipsychotics as outpatient - resume outpatient medications  LLQ pain  - with soft stools - will ask for CT abd/pelvis for further evaluation   Compression fractures - Seen on chest x-ray - follow-up outpatient - PT eval requested   Hypokalemia - mild, supplemented and WNL  DVT prophylaxis: Lovenox SQ Code Status: Full  Family Communication: Patient at bedside, no family at bedside this AM Disposition Plan: Back to SNF when medically ready, PT eval also requested   Consultants:   None  Procedures:   None  Antimicrobials:   Levaquin 11/29 -->  Subjective: No events overnight.   Objective: Vitals:   05/10/16 1453 05/10/16 2231 05/11/16 0602 05/11/16 1339  BP: 131/62 136/90 (!) 144/80 (!) 126/55  Pulse: 88 85 91 91  Resp: 19 18 18 18   Temp:  98.4 F (36.9 C) 98.5 F (36.9 C) 98.7 F (37.1 C)  TempSrc:  Oral Axillary Axillary  SpO2: 95% 98% 97% 99%  Weight:      Height:        Intake/Output Summary (Last 24 hours) at 05/11/16 1557 Last data filed at 05/11/16 1200  Gross per 24 hour  Intake          2220.34 ml  Output                0 ml  Net          2220.34 ml   Filed Weights   05/09/16 0827  Weight: 51.3 kg (113 lb)    Examination:  General exam: Appears calm and comfortable  Respiratory system: Respiratory effort normal. Rhonchi at bases  Cardiovascular system: S1 & S2 heard, RRR. No JVD, rubs, gallops or clicks. No pedal edema. Gastrointestinal system: Abdomen is nondistended, soft and nontender. No organomegaly or masses felt.   Data Reviewed: I have personally reviewed following labs and imaging studies  CBC:  Recent Labs Lab 05/09/16 0935 05/09/16 1637 05/10/16 0610 05/11/16 0510  WBC 4.8 4.0 3.9* 5.3  NEUTROABS 3.4  --   --   --   HGB 11.5* 11.3* 11.7* 13.2  HCT 35.2* 34.5* 34.7* 38.9*  MCV 95.1 94.0 93.0 91.5  PLT 192 199 218 257   Basic  Metabolic Panel:  Recent Labs Lab 05/09/16 0935 05/09/16 1637 05/10/16 0610 05/11/16 0510  NA 142  --  139 138  K 3.4*  --  3.4* 4.5  CL 107  --  107 104  CO2 29  --  27 22  GLUCOSE 127*  --  115* 110*  BUN 23*  --  13 8  CREATININE 1.06 0.78 0.69 0.73  CALCIUM 9.0  --  8.3* 8.5*   Liver Function Tests:  Recent Labs Lab 05/09/16 0935  AST 20  ALT 16*  ALKPHOS 60  BILITOT 0.7  PROT 7.1  ALBUMIN 3.6    Recent Labs Lab 05/09/16 1637  AMMONIA 10   Cardiac Enzymes:  Recent Labs Lab 05/09/16 1637  TROPONINI <0.03   Urine analysis:    Component Value Date/Time   COLORURINE AMBER (A) 05/09/2016 0950   APPEARANCEUR CLEAR 05/09/2016 0950   LABSPEC 1.029 05/09/2016 0950   PHURINE 5.5  05/09/2016 0950   GLUCOSEU NEGATIVE 05/09/2016 0950   HGBUR NEGATIVE 05/09/2016 0950   HGBUR trace-lysed 08/24/2008 1049   BILIRUBINUR SMALL (A) 05/09/2016 0950   KETONESUR NEGATIVE 05/09/2016 0950   PROTEINUR 30 (A) 05/09/2016 0950   UROBILINOGEN 1.0 04/16/2015 0055   NITRITE NEGATIVE 05/09/2016 0950   LEUKOCYTESUR NEGATIVE 05/09/2016 0950    Recent Results (from the past 240 hour(s))  MRSA PCR Screening     Status: None   Collection Time: 05/09/16  6:15 PM  Result Value Ref Range Status   MRSA by PCR NEGATIVE NEGATIVE Final     Radiology Studies: Dg Chest 2 View Result Date: 05/09/2016 Persistent streaky infiltrate/ pneumonia right lower lobe infrahilar. New streaky infiltrate or atelectasis left lower lobe. Followup PA and lateral chest X-ray is recommended in 3-4 weeks following trial of antibiotic therapy to ensure resolution and exclude underlying malignancy. Compression deformities lower thoracic spine of indeterminate age. Clinical correlation is necessary.  Scheduled Meds: . enoxaparin (LOVENOX) injection  40 mg Subcutaneous Q24H  . iopamidol      . iopamidol      . levofloxacin (LEVAQUIN) IV  750 mg Intravenous Q24H  . sodium chloride       Continuous Infusions: . dextrose 5 % and 0.45% NaCl 50 mL/hr at 05/10/16 1022    LOS: 2 days   Time spent: 20 minutes   Debbora PrestoMAGICK-Tyrae Alcoser, MD [facility] Pager [phone]  If 7PM-7AM, please contact night-coverage [email or web address] Password [facility] 05/11/2016, 3:57 PM

## 2016-05-11 NOTE — Clinical Social Work Note (Signed)
Clinical Social Work Assessment  Patient Details  Name: [name] MRN: [id number] Date of Birth: 1933-07-31  Date of referral:  05/11/16               Reason for consult:  Discharge Planning                Permission sought to share information with:  Facility Industrial/product designerContact Representative Permission granted to share information::  Yes, Verbal Permission Granted  Name::        Agency::     Relationship::     Contact Information:     Housing/Transportation Living arrangements for the past 2 months:  Assisted Living Facility (Memory Care) Source of Information:  Spouse Riley Ober(Louise) Patient Interpreter Needed:  None Criminal Activity/Legal Involvement Pertinent to Current Situation/Hospitalization:    Significant Relationships:  Spouse Riley Ober(Louise) Lives with:  Facility Resident [facility]) Do you feel safe going back to the place where you live?  Yes Need for family participation in patient care:  Yes (Comment)  Care giving concerns:  Patients spouse asked questions regarding restraints and is patient was still being aggressive.    Social Worker assessment / plan:  CSW spoke with patients spouse, [name], regarding discharge plans. [facility]. Patient is able to return to their facility after being off of restraints for 24 hours. [facility]. Patient spuse will be visiting on Sunday 12[facility]02 [facility] CSW provided contact if spouse had any further questions.   Employment status:  Retired Database administratornsurance information:  Managed Medicare PT Recommendations:  Skilled Nursing Facility Information / Referral to community resources:     Patient/Family's Response to care:  Patients spouse appreciated CSW.   Patient/Family's Understanding of and Emotional Response to Diagnosis, Current Treatment, and Prognosis:  Patients spouse understood current treatment and  prognosis.   Emotional Assessment Appearance:  Appears stated age Attitude/Demeanor/Rapport:  Unable to Assess Affect (typically observed):  Unable to Assess Orientation:    Alcohol / Substance use:    Psych involvement (Current and /or in the community):  No (Comment)  Discharge Needs  Concerns to be addressed:  No discharge needs identified Readmission within the last 30 days:  No Current discharge risk:  None Barriers to Discharge:  No Barriers Identified   Donnie Coffinrin M [name], LCSW 05/11/2016, 2:14 PM

## 2016-05-11 NOTE — Progress Notes (Signed)
Pt started c/o left sided pain and holding the left side of abdomen. Pt has had several thick stools today.PRN pain medication given with some relief. MD informed of left sided pain and patient stools and new orders given.

## 2016-05-12 DIAGNOSIS — Z515 Encounter for palliative care: Secondary | ICD-10-CM

## 2016-05-12 DIAGNOSIS — Z7189 Other specified counseling: Secondary | ICD-10-CM

## 2016-05-12 LAB — BASIC METABOLIC PANEL
Anion gap: 8 (ref 5–15)
BUN: 8 mg/dL (ref 6–20)
CHLORIDE: 103 mmol/L (ref 101–111)
CO2: 27 mmol/L (ref 22–32)
CREATININE: 0.8 mg/dL (ref 0.61–1.24)
Calcium: 8.4 mg/dL — ABNORMAL LOW (ref 8.9–10.3)
GFR calc Af Amer: >60 mL/min (ref >60)
GFR calc non Af Amer: >60 mL/min (ref >60)
Glucose, Bld: 117 mg/dL — ABNORMAL HIGH (ref 65–99)
POTASSIUM: 3.6 mmol/L (ref 3.5–5.1)
Sodium: 138 mmol/L (ref 135–145)

## 2016-05-12 LAB — CBC
HEMATOCRIT: 35.3 % — AB (ref 39.0–52.0)
HEMOGLOBIN: 12.1 g/dL — AB (ref 13.0–17.0)
MCH: 30.5 pg (ref 26.0–34.0)
MCHC: 34.3 g/dL (ref 30.0–36.0)
MCV: 88.9 fL (ref 78.0–100.0)
Platelets: 224 10*3/uL (ref 150–400)
RBC: 3.97 MIL/uL — ABNORMAL LOW (ref 4.22–5.81)
RDW: 13.9 % (ref 11.5–15.5)
WBC: 3.7 10*3/uL — ABNORMAL LOW (ref 4.0–10.5)

## 2016-05-12 MED ORDER — HYDROCERIN EX CREA
1.0000 "application " | TOPICAL_CREAM | Freq: Every day | CUTANEOUS | Status: DC
  Administered 2016-05-12 – 2016-05-14 (×3): 1 via TOPICAL
  Filled 2016-05-12: qty 113

## 2016-05-12 MED ORDER — LOPERAMIDE HCL 2 MG PO CAPS: 2.0000 mg | ORAL_CAPSULE | Freq: Four times a day (QID) | ORAL | Status: DC | PRN

## 2016-05-12 MED ORDER — LORATADINE 10 MG PO TABS
10.0000 mg | ORAL_TABLET | Freq: Every morning | ORAL | Status: DC
  Administered 2016-05-12 – 2016-05-14 (×3): 10 mg via ORAL
  Filled 2016-05-12 (×3): qty 1

## 2016-05-12 MED ORDER — TRAMADOL HCL 50 MG PO TABS
50.0000 mg | ORAL_TABLET | Freq: Four times a day (QID) | ORAL | Status: DC | PRN
  Administered 2016-05-14: 50 mg via ORAL
  Filled 2016-05-12: qty 1

## 2016-05-12 MED ORDER — QUETIAPINE FUMARATE 25 MG PO TABS
50.0000 mg | ORAL_TABLET | Freq: Every day | ORAL | Status: DC
  Administered 2016-05-12 – 2016-05-14 (×3): 50 mg via ORAL
  Filled 2016-05-12 (×3): qty 2

## 2016-05-12 MED ORDER — LORAZEPAM 0.5 MG PO TABS: 0.5000 mg | ORAL_TABLET | Freq: Two times a day (BID) | ORAL | Status: DC | PRN

## 2016-05-12 MED ORDER — DIVALPROEX SODIUM ER 500 MG PO TB24
500.0000 mg | ORAL_TABLET | Freq: Every day | ORAL | Status: DC
  Administered 2016-05-12 – 2016-05-14 (×3): 500 mg via ORAL
  Filled 2016-05-12 (×3): qty 1

## 2016-05-12 MED ORDER — LATANOPROST 0.005 % OP SOLN
1.0000 [drp] | Freq: Every day | OPHTHALMIC | Status: DC
  Administered 2016-05-12 – 2016-05-14 (×3): 1 [drp] via OPHTHALMIC
  Filled 2016-05-12: qty 2.5

## 2016-05-12 MED ORDER — ASPIRIN 81 MG PO CHEW
81.0000 mg | CHEWABLE_TABLET | Freq: Every morning | ORAL | Status: DC
  Administered 2016-05-12 – 2016-05-14 (×3): 81 mg via ORAL
  Filled 2016-05-12 (×3): qty 1

## 2016-05-12 MED ORDER — QUETIAPINE FUMARATE 100 MG PO TABS
100.0000 mg | ORAL_TABLET | Freq: Every day | ORAL | Status: DC
  Administered 2016-05-12 – 2016-05-13 (×2): 100 mg via ORAL
  Filled 2016-05-12 (×2): qty 1

## 2016-05-12 NOTE — Consult Note (Signed)
Consultation Note Date: 05/12/2016   Patient Name: [name]  DOB: 11-27-1933  MRN: [id number]  Age / Sex: 82 y.o., male  PCP: Sande Brothers, MD Referring Physician: Theodis Blaze, MD  Reason for Consultation: Establishing goals of care  HPI/Patient Profile: 82 y.o. male  admitted on 05/09/2016    Clinical Assessment and Goals of Care:  82 year old gentleman with a past medical history significant for dementia and dyslipidemia. Patient has been residing at [facility] for the last 8-9 years. Patient has been admitted to the hospital because he had been falling more than usual at his memory care unit. It is reported that he may have also hit his head and was having episodes of behavioral disturbances/agitation. Patient was admitted with dyspnea and cough diagnosed with pneumonia deemed possibly to have an aspiration component. Imaging at the time of admission also showed compression fractures in T11/T12 areas. There was some question of lung malignancy and repeat chest x-ray imaging was advised in the next coming 4-6 weeks. Patient has continued to have episodes of agitation requiring Haldol in this hospitalization as well as restraints. Patient has had minimal/nil oral intake. Hence, in light of advanced dementia, recent infection, subacute decline, palliative consultation has been obtained for goals of care discussions.  Patient is an elderly gentleman resting in bed. He has restraints on. He opens his eyes but does not engage meaningfully is not able to verbalize. Call placed to daughter Anderson Malta and met with the patient's wife Barbaraann Share and daughter Anderson Malta by the bedside this afternoon. Brief life history performed. It is reported that the patient has been a resident at [facility] for the last 8-9 years. Since the last 4-6 weeks, the patient has stopped recognizing his wife and daughter. He has referred to  them as " the help." He has had even more episodes of agitation and falls at the nursing home recently. Family states that the patient does not like to use a cane or walker and shuffles in his gait and leans towards the right. He has had diminished oral intake.  I introduced myself and palliative care as follows: Palliative medicine is specialized medical care for people living with serious illness. It focuses on providing relief from the symptoms and stress of a serious illness. The goal is to improve quality of life for both the patient and the family.  Disease trajectory pertaining to advanced dementia discussed in detail. Dysphagia/aspiration stemming from advanced stages of dementia also discussed. All questions answered. Patient's daughter and wife have established CODE STATUS of DO NOT RESUSCITATE/DO NOT INTUBATE. Additionally concept pertaining to artificial nutrition and hydration in the setting of advanced dementia also discussed. Family does not desire PEG tube placement.  Discussed about scope of current hospitalization. Although patient remains on appropriate antibiotics, does not demonstrate any signs of meaningful recovery. Hence, discussed about hospice particularly residential hospice services towards the end of this hospitalization. Family is agreeable but wishes to continue time trial of current therapies for at least the next 24 hours.  I will follow-up again on 12-3. See recommendations below. Thank you for the consult.  HCPOA  Patient's wife Barbaraann Share and daughter Anderson Malta are his primary decision makers. Patient has 3 children. 2 of his children are nurses.  SUMMARY OF RECOMMENDATIONS    1. CODE STATUS now established as DO NOT RESUSCITATE/DO NOT INTUBATE 2. Patient essentially with minimal/nil oral intake, subacute decline essentially since the last week. Monitor disease trajectory. Discussed with family about hospice services. Family agreeable to residential hospice at the time of  discharge should the patient continue to not eat/drink, have ongoing weakness/metabolic encephalopathy stemming from advanced age dementia 34. Continue current symptom management efforts  Code Status/Advance Care Planning:  DNR    Symptom Management:    Continue current scope of treatment. Agree with patient's regular medications such as Ativan, Seroquel, Depakote. Agree with use of Haldol when necessary agitation.  Palliative Prophylaxis:   Delirium Protocol     Psycho-social/Spiritual:   Desire for further Chaplaincy support:yes  Additional Recommendations: Education on Hospice  Prognosis:   < 2 weeks  Discharge Planning: Hospice facility? Will re assess in am.       Primary Diagnoses: Present on Admission: . Pneumonia . Dementia . Essential hypertension   I have reviewed the medical record, interviewed the patient and family, and examined the patient. The following aspects are pertinent.  Past Medical History:  Diagnosis Date  . Alzheimer disease 2011  . Bronchitis   . 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 COPD 11/05/2006  . DEMENTIA 12/19/2009  . DEPRESSION 11/05/2006  . DYSLIPIDEMIA 06/17/2008  . DYSURIA, HX OF 07/29/2008  . HERNIA 10/22/2007  . HX, PERSONAL, ALCOHOLISM 11/05/2006  . HYPERTENSION 11/05/2006  . LOW BACK PAIN 11/05/2006  . TOBACCO ABUSE 06/17/2008   Social History   Social History  . Marital status: Married    Spouse name: N/A  . Number of children: N/A  . Years of education: N/A   Social History Main Topics  . Smoking status: Former Smoker    Types: Cigarettes    Quit date: 06/11/2008  . Smokeless tobacco: Never Used  . Alcohol use No  . Drug use: No  . Sexual activity: Not Asked   Other Topics Concern  . None   Social History Narrative  . None   Family History  Problem Relation Age of Onset  . Stroke Mother   . Stroke Father    Scheduled Meds: . aspirin  81 mg Oral q morning - 10a  . divalproex  500 mg Oral Q  breakfast  . enoxaparin (LOVENOX) injection  40 mg Subcutaneous Q24H  . hydrocerin  1 application Topical Daily  . latanoprost  1 drop Both Eyes QHS  . levofloxacin (LEVAQUIN) IV  750 mg Intravenous Q24H  . loratadine  10 mg Oral q morning - 10a  . QUEtiapine  100 mg Oral QHS  . QUEtiapine  50 mg Oral Daily   Continuous Infusions: PRN Meds:.haloperidol lactate, loperamide, LORazepam, morphine injection, traMADol Medications Prior to Admission:  Prior to Admission medications   Medication Sig Start Date End Date Taking? Authorizing Provider  acetaminophen (TYLENOL) 500 MG tablet Take 500 mg by mouth 2 (two) times daily as needed for moderate pain.   Yes Historical Provider, MD  aspirin 81 MG chewable tablet Chew 81 mg by mouth every morning.   Yes Historical Provider, MD  azithromycin (ZITHROMAX) 250 MG tablet Take 1 tablet (250 mg total) by mouth daily. Take 2 tabs day 1, then take  1 tab day 2-5. 05/08/16  Yes Everlene Balls, MD  divalproex (DEPAKOTE ER) 500 MG 24 hr tablet Take 500 mg by mouth daily with breakfast.    Yes Historical Provider, MD  ENSURE (ENSURE) Take 237 mLs by mouth 3 (three) times daily between meals. *Vanilla*   Yes Historical Provider, MD  Glycerin-Hypromellose-PEG 400 (VISINE PURE TEARS) 0.2-0.2-1 % SOLN Place 1 drop into both eyes 2 (two) times daily.   Yes Historical Provider, MD  latanoprost (XALATAN) 0.005 % ophthalmic solution Place 1 drop into both eyes at bedtime.   Yes Historical Provider, MD  loperamide (IMODIUM) 2 MG capsule Take 2 mg by mouth every 6 (six) hours as needed for diarrhea or loose stools.    Yes Historical Provider, MD  loratadine (CLARITIN) 10 MG tablet Take 10 mg by mouth every morning.   Yes Historical Provider, MD  LORazepam (ATIVAN) 0.5 MG tablet Take 0.5 mg by mouth 2 (two) times daily as needed (aggitation).    Yes Historical Provider, MD  LORazepam (ATIVAN) 0.5 MG tablet Take 0.5 mg by mouth 2 (two) times daily.    Yes Historical Provider,  MD  mirtazapine (REMERON) 30 MG tablet Take 30 mg by mouth at bedtime.   Yes Historical Provider, MD  Multiple Vitamin (MULTIVITAMIN WITH MINERALS) TABS tablet Take 1 tablet by mouth every morning.   Yes Historical Provider, MD  pravastatin (PRAVACHOL) 10 MG tablet Take 10 mg by mouth at bedtime.    Yes Historical Provider, MD  QUEtiapine (SEROQUEL) 50 MG tablet Take 50-100 mg by mouth 2 (two) times daily. 81m in the morning and 100 mg at bedtime   Yes Historical Provider, MD  selenium sulfide (SELSUN) 1 % LOTN Apply 1 application topically 3 (three) times a week. Uses on Tuesday, Thursday, and Friday   Yes Historical Provider, MD  senna-docusate (SENOKOT-S) 8.6-50 MG tablet Take 1 tablet by mouth daily with breakfast.    Yes Historical Provider, MD  Skin Protectants, Misc. (BAZA PROTECT EX) Apply 1 application topically 3 (three) times daily as needed (apply to buttocks and sacrum).    Yes Historical Provider, MD  Skin Protectants, Misc. (MINERIN) CREA Apply 1 application topically daily.   Yes Historical Provider, MD  traMADol (ULTRAM) 50 MG tablet Take 50 mg by mouth every 6 (six) hours as needed (for pain).   Yes Historical Provider, MD  haloperidol (HALDOL) 2 MG tablet Take 1 tablet (2 mg total) by mouth 2 (two) times daily. Patient not taking: Reported on 05/09/2016 04/19/15   ARobbie Lis MD   Allergies  Allergen Reactions  . Sulfa Antibiotics Rash   Review of Systems Non verbal  Physical Exam Weak elderly gentleman resting in bed Soft restraints on Opens eyes when name is called, otherwise does not verbalize or follow commands Not combative currently Shallow breathing regular Abdomen soft Thin extremities, no edema   Vital Signs: BP (!) 141/73 (BP Location: Left Arm) Comment: rn. notify  Pulse 91   Temp 98.4 F (36.9 C) (Oral)   Resp 18   Ht 5' 9" (1.753 m)   Wt 51.3 kg (113 lb)   SpO2 96%   BMI 16.69 kg/m  Pain Assessment: No/denies pain   Pain Score: 0-No  pain   SpO2: SpO2: 96 % O2 Device:SpO2: 96 % O2 Flow Rate: .O2 Flow Rate (L/min): 2 L/min  IO: Intake/output summary:  Intake/Output Summary (Last 24 hours) at 05/12/16 1606 Last data filed at 05/12/16 0900  Gross per 24 hour  Intake          1121.67 ml  Output              500 ml  Net           621.67 ml    LBM: Last BM Date: 05/12/16 Baseline Weight: Weight: 51.3 kg (113 lb) Most recent weight: Weight: 51.3 kg (113 lb)     Palliative Assessment/Data:   Flowsheet Rows   Flowsheet Row Most Recent Value  Intake Tab  Referral Department  Hospitalist  Unit at Time of Referral  Med/Surg Unit  Palliative Care Primary Diagnosis  Neurology  Palliative Care Type  New Palliative care  Reason for referral  Non-pain Symptom, Clarify Goals of Care, Counsel Regarding Hospice  Date first seen by Palliative Care  05/12/16  Clinical Assessment  Palliative Performance Scale Score  20%  Pain Max last 24 hours  5  Pain Min Last 24 hours  4  Dyspnea Max Last 24 Hours  5  Dyspnea Min Last 24 hours  4  Anxiety Max Last 24 Hours  7  Anxiety Min Last 24 Hours  5  Psychosocial & Spiritual Assessment  Palliative Care Outcomes  Patient/Family meeting held?  Yes  Who was at the meeting?  wife daughter   Palliative Care Outcomes  Clarified goals of care, Changed CPR status, Transitioned to hospice, Changed to focus on comfort      Time In:  1500 Time Out:  1610 Time Total:  70 min  Greater than 50%  of this time was spent counseling and coordinating care related to the above assessment and plan.  Signed by: Loistine Chance, MD  [phone]  Please contact Palliative Medicine Team phone at [phone] for questions and concerns.  For individual provider: See Shea Evans

## 2016-05-12 NOTE — Progress Notes (Signed)
Patient ID: [name], male   DOB: 1933-10-17, 82 y.o.   MRN: [id number]    PROGRESS NOTE   [name]  WUJ:[id number] DOB: 1933-10-17 DOA: 05/09/2016  PCP: Ron ParkerBOWEN,SAMUEL, MD   Brief Narrative:  82 y.o. male with medical history significant of psychotic disorder, dementia, anxiety, hyperlipidemia. Patient presented after a recent history of being combative. Per family, this is a big step off of baseline. He was recently seen (yesterday) for dyspnea and coughing and was being treated for a pneumonia. He was treated with azithromycin but he did not seem to be improving.   Assessment & Plan: Acute respiratory failure secondary to pneumonia, HCAP - Patient is from a SNF. He was diagnosed with pneumonia on previous ED visit and was increasingly more altered  - Concern for possible aspiration - Levaquin started per pharmacy, continue same ABX for now day #4 - follow-up blood cultures - SLP eval done, dys III diet recmmended - pt will need repeat chest x-ray in 3-4 weeks to ensure resolution and no evidence of malignancy  Alzheimer's Dementia with behavioral disturbance, Delirium - History of dementia but likely made worse secondary to acute infection - patient has been combative and has required the use of restraints - use Haldol as needed - PCT consulted to discuss further GOC - pt on scheduled and as needed Ativan at home, will continue only as needed Ativan here to avoid oversedation  - hold remeron as well  - SLP done and dys III diet recommended   Dehydration in the setting of poor oral intake and PNA - elevated BUN, pre renal acute kidney injury - resolved with IVF, will stop IVF for now as pt more agitated with IV lines - PCT consulted for further goals of care   History of psychosis - Treated with antipsychotics as outpatient - resumed outpatient medications Seroquel and Depakote  LLQ pain  - with soft stools - has been on imodium in the past - CT abd with no acute  finding to explain the pain  - no pain this AM   Compression fractures - Seen on chest x-ray - follow-up outpatient - PT eval requested, SNF recommended, SW consulted for assistance   Hypokalemia - mild, supplemented and WNL - BMP in AM  DVT prophylaxis: Lovenox SQ Code Status: Full  Family Communication: Patient at bedside, no family at bedside this AM Disposition Plan: Back to SNF when medically ready  Consultants:   PCT  PT  SLP  Procedures:   None  Antimicrobials:   Levaquin 11/29 -->  Subjective: No events overnight.   Objective: Vitals:   05/11/16 0602 05/11/16 1339 05/11/16 2020 05/12/16 0450  BP: (!) 144/80 (!) 126/55 (!) 174/73 134/85  Pulse: 91 91 (!) 106 91  Resp: 18 18 18 20   Temp: 98.5 F (36.9 C) 98.7 F (37.1 C) 97.7 F (36.5 C) 98.4 F (36.9 C)  TempSrc: Axillary Axillary Axillary Axillary  SpO2: 97% 99% 98% 98%  Weight:      Height:        Intake/Output Summary (Last 24 hours) at 05/12/16 0824 Last data filed at 05/12/16 0451  Gross per 24 hour  Intake             1812 ml  Output              500 ml  Net             1312 ml   Filed Weights   05/09/16  0827  Weight: 51.3 kg (113 lb)    Examination:  General exam: Appears calm and comfortable  Respiratory system: Respiratory effort normal. Rhonchi at bases  Cardiovascular system: S1 & S2 heard, RRR. No JVD, rubs, gallops or clicks. No pedal edema. Gastrointestinal system: Abdomen is nondistended, soft and nontender. No organomegaly or masses felt.   Data Reviewed: I have personally reviewed following labs and imaging studies  CBC:  Recent Labs Lab 05/09/16 0935 05/09/16 1637 05/10/16 0610 05/11/16 0510 05/12/16 0606  WBC 4.8 4.0 3.9* 5.3 3.7*  NEUTROABS 3.4  --   --   --   --   HGB 11.5* 11.3* 11.7* 13.2 12.1*  HCT 35.2* 34.5* 34.7* 38.9* 35.3*  MCV 95.1 94.0 93.0 91.5 88.9  PLT 192 199 218 257 224   Basic Metabolic Panel:  Recent Labs Lab 05/09/16 0935  05/09/16 1637 05/10/16 0610 05/11/16 0510 05/12/16 0606  NA 142  --  139 138 138  K 3.4*  --  3.4* 4.5 3.6  CL 107  --  107 104 103  CO2 29  --  27 22 27   GLUCOSE 127*  --  115* 110* 117*  BUN 23*  --  13 8 8   CREATININE 1.06 0.78 0.69 0.73 0.80  CALCIUM 9.0  --  8.3* 8.5* 8.4*   Liver Function Tests:  Recent Labs Lab 05/09/16 0935  AST 20  ALT 16*  ALKPHOS 60  BILITOT 0.7  PROT 7.1  ALBUMIN 3.6    Recent Labs Lab 05/09/16 1637  AMMONIA 10   Cardiac Enzymes:  Recent Labs Lab 05/09/16 1637  TROPONINI <0.03   Urine analysis:    Component Value Date/Time   COLORURINE AMBER (A) 05/09/2016 0950   APPEARANCEUR CLEAR 05/09/2016 0950   LABSPEC 1.029 05/09/2016 0950   PHURINE 5.5 05/09/2016 0950   GLUCOSEU NEGATIVE 05/09/2016 0950   HGBUR NEGATIVE 05/09/2016 0950   HGBUR trace-lysed 08/24/2008 1049   BILIRUBINUR SMALL (A) 05/09/2016 0950   KETONESUR NEGATIVE 05/09/2016 0950   PROTEINUR 30 (A) 05/09/2016 0950   UROBILINOGEN 1.0 04/16/2015 0055   NITRITE NEGATIVE 05/09/2016 0950   LEUKOCYTESUR NEGATIVE 05/09/2016 0950    Recent Results (from the past 240 hour(s))  MRSA PCR Screening     Status: None   Collection Time: 05/09/16  6:15 PM  Result Value Ref Range Status   MRSA by PCR NEGATIVE NEGATIVE Final     Radiology Studies: Dg Chest 2 View Result Date: 05/09/2016 Persistent streaky infiltrate/ pneumonia right lower lobe infrahilar. New streaky infiltrate or atelectasis left lower lobe. Followup PA and lateral chest X-ray is recommended in 3-4 weeks following trial of antibiotic therapy to ensure resolution and exclude underlying malignancy. Compression deformities lower thoracic spine of indeterminate age. Clinical correlation is necessary.   CT abd and pelvis with contrast  05/11/2016 1. Increased anterior wedging of T11 and T12 since April 2017. There is no apparent adjacent soft tissue edema on this study. Recommend clinical correlation in this region  for acute pain. 2. Persistent infiltrate in the right lung base. 3. Stable repair of the abdominal aorta. 4. No other interval changes.    Scheduled Meds: . enoxaparin (LOVENOX) injection  40 mg Subcutaneous Q24H  . levofloxacin (LEVAQUIN) IV  750 mg Intravenous Q24H   Continuous Infusions: . dextrose 5 % and 0.45% NaCl 50 mL/hr at 05/12/16 0300    LOS: 3 days   Time spent: 20 minutes   Debbora Presto, MD [facility] Pager [phone]  If 7PM-7AM, please contact night-coverage [email or web address] Password [email or web address] 05/12/2016, 8:24 AM

## 2016-05-12 NOTE — Clinical Social Work Note (Signed)
New SNF order placed today for patient for this patient who is current a resident of [facility].  Weekday SW has already confirmed with patient's wife and with the facility that plan will be for patient to return to same facility and can provide PT services there.  Patient must be restraint free for at least 24 hours prior to d/c.  SW services will monitor for possible date of stability. Lorri Frederickonna T. Jaci LazierCrowder, KentuckyLCSW [phone] (weekend coverage)

## 2016-05-13 ENCOUNTER — Encounter (HOSPITAL_COMMUNITY): Payer: Self-pay

## 2016-05-13 LAB — BASIC METABOLIC PANEL
Anion gap: 8 (ref 5–15)
BUN: 10 mg/dL (ref 6–20)
CALCIUM: 8.7 mg/dL — AB (ref 8.9–10.3)
CO2: 29 mmol/L (ref 22–32)
CREATININE: 0.88 mg/dL (ref 0.61–1.24)
Chloride: 103 mmol/L (ref 101–111)
GFR calc non Af Amer: >60 mL/min (ref >60)
Glucose, Bld: 113 mg/dL — ABNORMAL HIGH (ref 65–99)
Potassium: 3.3 mmol/L — ABNORMAL LOW (ref 3.5–5.1)
SODIUM: 140 mmol/L (ref 135–145)

## 2016-05-13 LAB — CBC
HEMATOCRIT: 35.7 % — AB (ref 39.0–52.0)
HEMOGLOBIN: 12 g/dL — AB (ref 13.0–17.0)
MCH: 31.2 pg (ref 26.0–34.0)
MCHC: 33.6 g/dL (ref 30.0–36.0)
MCV: 92.7 fL (ref 78.0–100.0)
Platelets: 218 10*3/uL (ref 150–400)
RBC: 3.85 MIL/uL — AB (ref 4.22–5.81)
RDW: 14.3 % (ref 11.5–15.5)
WBC: 2.7 10*3/uL — AB (ref 4.0–10.5)

## 2016-05-13 MED ORDER — LEVOFLOXACIN IN D5W 750 MG/150ML IV SOLN: 750.0000 mg | INTRAVENOUS | Status: DC

## 2016-05-13 MED ORDER — POTASSIUM CHLORIDE CRYS ER 20 MEQ PO TBCR
40.0000 meq | EXTENDED_RELEASE_TABLET | Freq: Once | ORAL | Status: AC
  Administered 2016-05-13: 40 meq via ORAL
  Filled 2016-05-13: qty 2

## 2016-05-13 NOTE — Progress Notes (Signed)
CSW consulted to help with possible residential hospice placement.  Per palliative MD note patient has poor prognosis and would like for him to be evaluated to see if appropriate for residential hospice placement vs return to ALF with hospice care.  CSW spoke with pt dtr who confirmed this plan and prefers [facility] if pt qualifies for residential.  [facility] for [facility].  CSW will continue to follow  Burna SisJenna H. Kewana Sanon, LCSW Clinical Social Worker [phone]

## 2016-05-13 NOTE — Plan of Care (Signed)
Problem: Physical Regulation: Goal: Ability to maintain clinical measurements within normal limits will improve Outcome: Completed/Met Date Met: 05/13/16 At baseline Goal: Will remain free from infection Outcome: Completed/Met Date Met: 05/13/16 No new infection  Problem: Nutrition: Goal: Adequate nutrition will be maintained Outcome: Completed/Met Date Met: 05/13/16 At baseline

## 2016-05-13 NOTE — Progress Notes (Addendum)
Patient ID: [name], male   DOB: 1933-12-06, 82 y.o.   MRN: [id number]    PROGRESS NOTE   [name]  [id number]:[id number] DOB: 1933-12-06 DOA: 05/09/2016  PCP: Ron ParkerBOWEN,SAMUEL, MD   Brief Narrative:  82 y.o. male with medical history significant of psychotic disorder, dementia, anxiety, hyperlipidemia. Patient presented after [name] recent history of being combative. Per family, this is [name] big step off of baseline. He was recently seen (yesterday) for dyspnea and coughing and was being treated for [name] pneumonia. He was treated with azithromycin but he did not seem to be improving.   Assessment & Plan: Acute respiratory failure secondary to pneumonia, HCAP - [facility] from [name]. He was diagnosed with pneumonia on previous ED visit and was increasingly more altered  - Concern for possible aspiration - Levaquin started per pharmacy, continue same ABX for now day #5 - SLP eval done, dys III diet recommended - pt looks more alert this am but still with intermittent confusion episodes  - overall guarded prognosis, PCT consulted and ?if pt eligible for residential hospice, family in agreement  - repeat chest x-ray in 3-4 weeks to ensure resolution and no evidence of malignancy was recommended but this may not be needed if pt to be discharged [facility]   Alzheimer's Dementia with behavioral disturbance, Delirium - History of dementia but likely made worse secondary to acute infection - patient has been combative and has required the use of restraints - pt on scheduled and as needed Ativan at home, will continue only as needed Ativan here to avoid oversedation  - hold remeron as well  - PCT consulted and family in agreement with residential hospice if pt eligible   Dehydration in the setting of poor oral intake and PNA - elevated BUN, pre renal acute kidney injury - resolved with IVF, pt more agitated with IV lines, IVF stopped  - PCT consulted, appreciate assistance   History of  psychosis - Treated with antipsychotics as outpatient - resumed outpatient medications Seroquel and Depakote  LLQ pain  - with soft stools - has been on imodium in the past - CT abd with no acute finding to explain the pain  - no pain this AM   Compression fractures - Seen on chest x-ray - follow-up outpatient - PT eval requested, SNF recommended, residential hospice appropriate as well given advanced nature of his illness and guarded prognosis   Hypokalemia - mild, supplement - BMP in AM  Leukopenia - unclear etiology - currently on ABX - will reassess in AM  Severe PCM, underweight  - Body mass index is 16.69 kg/m.  DVT prophylaxis: Lovenox SQ Code Status: DNR Family Communication: Patient at bedside, no family at bedside this AM Disposition Plan: Back to SNF vs residential hospice   Consultants:   PCT  PT  SLP  Nutritionist   Procedures:   None  Antimicrobials:   Levaquin 11/29 -->  Subjective: No events overnight.   Objective: Vitals:   05/12/16 0450 05/12/16 1359 05/12/16 2150 05/13/16 0533  BP: 134/85 (!) 141/73 120/76 126/67  Pulse: 91 91 92 84  Resp: 20 18 18 18   Temp: 98.4 F (36.9 C) 98.4 F (36.9 C) 98.7 F (37.1 C) 97.7 F (36.5 C)  TempSrc: Axillary Oral Axillary Oral  SpO2: 98% 96% 100% 100%  Weight:      Height:        Intake/Output Summary (Last 24 hours) at 05/13/16 1351 Last data filed at 05/13/16  1243  Gross per 24 hour  Intake              915 ml  Output              350 ml  Net              565 ml   Filed Weights   05/09/16 0827  Weight: 51.3 kg (113 lb)    Examination:  General exam: Appears calm and comfortable, more alert this am  Respiratory system: Respiratory effort normal. Rhonchi at bases  Cardiovascular system: S1 & S2 heard, RRR. No JVD, rubs, gallops or clicks. No pedal edema. Gastrointestinal system: Abdomen is nondistended, soft and nontender. No organomegaly or masses felt.   Data Reviewed:  I have personally reviewed following labs and imaging studies  CBC:  Recent Labs Lab 05/09/16 0935 05/09/16 1637 05/10/16 0610 05/11/16 0510 05/12/16 0606 05/13/16 0555  WBC 4.8 4.0 3.9* 5.3 3.7* 2.7*  NEUTROABS 3.4  --   --   --   --   --   HGB 11.5* 11.3* 11.7* 13.2 12.1* 12.0*  HCT 35.2* 34.5* 34.7* 38.9* 35.3* 35.7*  MCV 95.1 94.0 93.0 91.5 88.9 92.7  PLT 192 199 218 257 224 218   Basic Metabolic Panel:  Recent Labs Lab 05/09/16 0935 05/09/16 1637 05/10/16 0610 05/11/16 0510 05/12/16 0606 05/13/16 0555  NA 142  --  139 138 138 140  K 3.4*  --  3.4* 4.5 3.6 3.3*  CL 107  --  107 104 103 103  CO2 29  --  27 22 27 29   GLUCOSE 127*  --  115* 110* 117* 113*  BUN 23*  --  13 8 8 10   CREATININE 1.06 0.78 0.69 0.73 0.80 0.88  CALCIUM 9.0  --  8.3* 8.5* 8.4* 8.7*   Liver Function Tests:  Recent Labs Lab 05/09/16 0935  AST 20  ALT 16*  ALKPHOS 60  BILITOT 0.7  PROT 7.1  ALBUMIN 3.6    Recent Labs Lab 05/09/16 1637  AMMONIA 10   Cardiac Enzymes:  Recent Labs Lab 05/09/16 1637  TROPONINI <0.03   Urine analysis:    Component Value Date/Time   COLORURINE AMBER ([name]) 05/09/2016 0950   APPEARANCEUR CLEAR 05/09/2016 0950   LABSPEC 1.029 05/09/2016 0950   PHURINE 5.5 05/09/2016 0950   GLUCOSEU NEGATIVE 05/09/2016 0950   HGBUR NEGATIVE 05/09/2016 0950   HGBUR trace-lysed 08/24/2008 1049   BILIRUBINUR SMALL ([name]) 05/09/2016 0950   KETONESUR NEGATIVE 05/09/2016 0950   PROTEINUR 30 ([name]) 05/09/2016 0950   UROBILINOGEN 1.0 04/16/2015 0055   NITRITE NEGATIVE 05/09/2016 0950   LEUKOCYTESUR NEGATIVE 05/09/2016 0950    Recent Results (from the past 240 hour(s))  MRSA PCR Screening     Status: None   Collection Time: 05/09/16  6:15 PM  Result Value Ref Range Status   MRSA by PCR NEGATIVE NEGATIVE Final     Radiology Studies: Dg Chest 2 View Result Date: 05/09/2016 Persistent streaky infiltrate/ pneumonia right lower lobe infrahilar. New streaky infiltrate  or atelectasis left lower lobe. Followup PA and lateral chest X-ray is recommended in 3-4 weeks following trial of antibiotic therapy to ensure resolution and exclude underlying malignancy. Compression deformities lower thoracic spine of indeterminate age. Clinical correlation is necessary.   CT abd and pelvis with contrast  05/11/2016 1. Increased anterior wedging of T11 and T12 since April 2017. There is no apparent adjacent soft tissue edema on this study. Recommend clinical correlation  in this region for acute pain. 2. Persistent infiltrate in the right lung base. 3. Stable repair of the abdominal aorta. 4. No other interval changes.    Scheduled Meds: . aspirin  81 mg Oral q morning - 10a  . divalproex  500 mg Oral Q breakfast  . enoxaparin (LOVENOX) injection  40 mg Subcutaneous Q24H  . hydrocerin  1 application Topical Daily  . latanoprost  1 drop Both Eyes QHS  . [START ON 05/15/2016] levofloxacin (LEVAQUIN) IV  750 mg Intravenous Q48H  . loratadine  10 mg Oral q morning - 10a  . QUEtiapine  100 mg Oral QHS  . QUEtiapine  50 mg Oral Daily   Continuous Infusions:   LOS: 4 days   Time spent: 20 minutes   Debbora PrestoMAGICK-Santrice Muzio, MD [facility] Pager [phone]  If 7PM-7AM, please contact night-coverage [email or web address] Password [email or web address] 05/13/2016, 1:51 PM

## 2016-05-13 NOTE — Plan of Care (Signed)
Problem: Activity: Goal: Ability to tolerate increased activity will improve Outcome: Completed/Met Date Met: 05/13/16 At baseline

## 2016-05-13 NOTE — Progress Notes (Signed)
Daily Progress Note   Patient Name: [name]       Date: 05/13/2016 DOB: [birth year]  Age: 82 y.o. MRN#: [id number] Attending Physician: Dorothea OgleIskra M Myers, MD Primary Care Physician: Ron ParkerBOWEN,SAMUEL, MD Admit Date: 05/09/2016  Reason for Consultation/Follow-up: Establishing goals of care  Life limiting illness: advanced dementia, recurrent falls, compression fractures,agitation, now admitted with PNA.   Subjective:  more awake alert, cooperative with nurse techs feeding him chocolate pudding. Not combative Smiles, mumbles incoherently Restrains still on.   Length of Stay: 4  Current Medications: Scheduled Meds:  . aspirin  81 mg Oral q morning - 10a  . divalproex  500 mg Oral Q breakfast  . enoxaparin (LOVENOX) injection  40 mg Subcutaneous Q24H  . hydrocerin  1 application Topical Daily  . latanoprost  1 drop Both Eyes QHS  . levofloxacin (LEVAQUIN) IV  750 mg Intravenous Q24H  . loratadine  10 mg Oral q morning - 10a  . QUEtiapine  100 mg Oral QHS  . QUEtiapine  50 mg Oral Daily    Continuous Infusions:   PRN Meds: haloperidol lactate, loperamide, LORazepam, morphine injection, traMADol  Physical Exam         NAD Thin  S1 S2 Shallow clear Abdomen soft No edema, some bruises  Vital Signs: BP 126/67 (BP Location: Left Arm)   Pulse 84   Temp 97.7 F (36.5 C) (Oral)   Resp 18   Ht 5\' 9"  (1.753 m)   Wt 51.3 kg (113 lb)   SpO2 100%   BMI 16.69 kg/m  SpO2: SpO2: 100 % O2 Device: O2 Device: Nasal Cannula O2 Flow Rate: O2 Flow Rate (L/min): 2 L/min  Intake/output summary:  Intake/Output Summary (Last 24 hours) at 05/13/16 0939 Last data filed at 05/13/16 0534  Gross per 24 hour  Intake              310 ml  Output              350 ml  Net              -40 ml     LBM: Last BM Date: 05/12/16 Baseline Weight: Weight: 51.3 kg (113 lb) Most recent weight: Weight: 51.3 kg (113 lb)       Palliative Assessment/Data:    Flowsheet Rows  Flowsheet Row Most Recent Value  Intake Tab  Referral Department  Hospitalist  Unit at Time of Referral  Med/Surg Unit  Palliative Care Primary Diagnosis  Other (Comment)  Palliative Care Type  Return patient Palliative Care  Reason for referral  Clarify Goals of Care  Date first seen by Palliative Care  05/12/16  Clinical Assessment  Palliative Performance Scale Score  30%  Pain Max last 24 hours  4  Pain Min Last 24 hours  3  Dyspnea Max Last 24 Hours  4  Dyspnea Min Last 24 hours  3  Anxiety Max Last 24 Hours  7  Anxiety Min Last 24 Hours  5  Psychosocial & Spiritual Assessment  Palliative Care Outcomes  Patient/Family meeting held?  Yes  Who was at the meeting?  discussed again with daughter Lang SnowHCPOA Jennifer over the phone.   Palliative Care Outcomes  Clarified goals of care      Patient Active Problem List   Diagnosis Date Noted  . Encounter for palliative care   . Goals of care, counseling/discussion   . Community acquired pneumonia of left lower lobe of lung (HCC) 05/09/2016  . Colitis presumed infectious 04/15/2015  . Diarrhea 04/15/2015  . AKI (acute kidney injury) (HCC) 04/15/2015  . Occlusion and stenosis of carotid artery without mention of cerebral infarction 01/16/2012  . Dementia with aggressive behavior 12/19/2009  . 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 DYSURIA, HX OF 07/29/2008  . DYSLIPIDEMIA 06/17/2008  . TOBACCO ABUSE 06/17/2008  . HERNIA 10/22/2007  . DEPRESSION 11/05/2006  . Essential hypertension 11/05/2006  . COPD 11/05/2006  . LOW BACK PAIN 11/05/2006  . HX, PERSONAL, ALCOHOLISM 11/05/2006    Palliative Care Assessment & Plan   Patient Profile:    Assessment:  dementia with agitation PNA compresison [facility]  Recommendations/Plan:   Discussed again with patient's daughter over the phone, patient has had a rather sub acute decline with worsening falls, agitation and declining PO intake in the past 1 month. He no longer recognizes his wife and daughter. They are in full understanding of the advanced incurable nature of his condition. Family would like to proceed with hospice evaluation, in consideration for transfer to residential hospice towards the end of this hospitalization. If not, they are also agreeable to [facility] services depending on his disease trajectory.    Code Status:    Code Status Orders        Start     Ordered   05/12/16 1554  Do not attempt resuscitation (DNR)  Continuous    Question Answer Comment  In the event of cardiac or respiratory ARREST Do not call a "code blue"   In the event of cardiac or respiratory ARREST Do not perform Intubation, CPR, defibrillation or ACLS   In the event of cardiac or respiratory ARREST Use medication by any route, position, wound care, and other measures to relive pain and suffering. May use oxygen, suction and manual treatment of airway obstruction as needed for comfort.      05/12/16 1554    Code Status History    Date Active Date Inactive Code Status Order ID Comments User Context   05/09/2016  3:58 PM 05/12/2016  3:54 PM Full Code [id number]  Narda Bondsalph A Nettey, MD Inpatient   05/09/2016  3:58 PM 05/09/2016  3:58 PM Full Code [id number]  Narda Bondsalph A Nettey, MD Inpatient   04/15/2015 11:46 PM 04/20/2015  1:34 PM Full  Code [id number]  Hillary Bow, DO ED       Prognosis:   < 4 weeks  Discharge Planning:  To Be Determined  Care plan was discussed with  Daughter Victorino Dike.   Thank you for allowing the Palliative Medicine Team to assist in the care of this patient.   Time In: 9 Time Out: 9.25 Total Time 25 Prolonged Time Billed  no       Greater than 50%  of this time was spent counseling  and coordinating care related to the above assessment and plan.  Rosalin Hawking, MD [phone]  Please contact Palliative Medicine Team phone at [phone] for questions and concerns.

## 2016-05-13 NOTE — Progress Notes (Signed)
Pharmacy Antibiotic Note  [name] is a 82 y.o. male admitted on 05/09/2016 with pneumonia.  Patient was diagnosed with pneumonia and started on azithromycin on 11/28.  Pharmacy is now consulted for Levaquin dosing.  Has received a dose of 500mg  IV in the ED at 13:13.  Plan: [name] #5 Antibiotics  Suggest stopping levofloxacin since completed 5-days of therapy this morning (FDA indication to stop after 5-days treatment w/ 750mg  dose).  IF to Continue Levaquin will need to adjust dose to 750mg  q48h for CrCl < 50 ml/min  Follow up renal function  Height: 5\' 9"  (175.3 cm) Weight: 113 lb (51.3 kg) IBW/kg (Calculated) : 70.7  Temp (24hrs), Avg:98.3 F (36.8 C), Min:97.7 F (36.5 C), Max:98.7 F (37.1 C)   Recent Labs Lab 05/09/16 1637 05/10/16 0610 05/11/16 0510 05/12/16 0606 05/13/16 0555  WBC 4.0 3.9* 5.3 3.7* 2.7*  CREATININE 0.78 0.69 0.73 0.80 0.88    Estimated Creatinine Clearance: 48.6 mL/min (by C-G formula based on SCr of 0.88 mg/dL).    Allergies  Allergen Reactions  . Sulfa Antibiotics Rash    Antimicrobials this admission:  11/29 levaquin >>  Microbiology results:  11/29 BCx x 2: ngtd 11/29 MRSA PCR: negative Thank you for allowing pharmacy to be a part of this patient's care.  Juliette Alcideustin Itzabella Sorrels, PharmD, BCPS.   Pager: [phone] 05/13/2016 12:01 PM

## 2016-05-13 NOTE — Consult Note (Signed)
[facility] Liaison  Received request from CSW [place] for [facility] interest [facility]. Chart reviewed. No family present. Spoke with daughter by phone at length to explain services and confirm goals. Appreciate report from bedside RN. [facility] physician to review clinical information in am. Will update daughter and CSW in am re availability/eligibility.   Thank you.  Forrestine Himva Davis, LCSW [phone]

## 2016-05-14 DIAGNOSIS — D703 Neutropenia due to infection: Secondary | ICD-10-CM

## 2016-05-14 DIAGNOSIS — E43 Unspecified severe protein-calorie malnutrition: Secondary | ICD-10-CM

## 2016-05-14 LAB — CBC
HCT: 36.5 % — ABNORMAL LOW (ref 39.0–52.0)
Hemoglobin: 12.1 g/dL — ABNORMAL LOW (ref 13.0–17.0)
MCH: 30.7 pg (ref 26.0–34.0)
MCHC: 33.2 g/dL (ref 30.0–36.0)
MCV: 92.6 fL (ref 78.0–100.0)
PLATELETS: 240 10*3/uL (ref 150–400)
RBC: 3.94 MIL/uL — ABNORMAL LOW (ref 4.22–5.81)
RDW: 14.1 % (ref 11.5–15.5)
WBC: 4.1 10*3/uL (ref 4.0–10.5)

## 2016-05-14 LAB — BASIC METABOLIC PANEL
Anion gap: 8 (ref 5–15)
BUN: 15 mg/dL (ref 6–20)
CALCIUM: 8.8 mg/dL — AB (ref 8.9–10.3)
CO2: 29 mmol/L (ref 22–32)
CREATININE: 0.92 mg/dL (ref 0.61–1.24)
Chloride: 103 mmol/L (ref 101–111)
Glucose, Bld: 105 mg/dL — ABNORMAL HIGH (ref 65–99)
Potassium: 3.5 mmol/L (ref 3.5–5.1)
SODIUM: 140 mmol/L (ref 135–145)

## 2016-05-14 LAB — CULTURE, BLOOD (ROUTINE X 2)
Culture: NO GROWTH
Culture: NO GROWTH

## 2016-05-14 MED ORDER — LORAZEPAM 0.5 MG PO TABS: 0.5000 mg | ORAL_TABLET | ORAL | Status: AC | PRN

## 2016-05-14 MED ORDER — MORPHINE SULFATE (CONCENTRATE) 10 MG /0.5 ML PO SOLN: 10.0000 mg | ORAL | Status: AC | PRN

## 2016-05-14 NOTE — Discharge Summary (Signed)
Physician Discharge Summary  [name] ZOX:[id number] DOB: [birth year] DOA: 05/09/2016  PCP: Ron Parker, MD  Admit date: 05/09/2016 Discharge date: 05/14/2016  Time spent: 35 minutes  Recommendations for Outpatient Follow-up:  Symptomatic management, end of life care and comfort measures   Discharge Diagnoses:  Active Problems:   Dementia with aggressive behavior   Essential hypertension   Community acquired pneumonia of left lower lobe of lung (HCC)   Encounter for palliative care   Goals of care, counseling/discussion   Protein-calorie malnutrition, severe (HCC)   Neutropenia associated with infection (HCC)   Discharge Condition: stable and currently comfortable. No agitation or distress on exam.  Diet recommendation: comfort feeding (dysphagia 3 diet)  Filed Weights   05/09/16 0827  Weight: 51.3 kg (113 lb)    History of present illness:  82 y.o. male with medical history significant of psychotic disorder, dementia, anxiety, hyperlipidemia. Patient presents after a recent history of being combative. Per family, this is a big step off of baseline. He was seen in ED on 11/28, diagnosed with PNA and discharge on Zithromax. Since then he was getting more aggressive and continue to have productive cough. Brought back to ED for further evaluation. On evaluation found to be mildly hypoxic and significantly agitated. Admitted for treatment of PNA and psychosis.  Hospital Course:  Acute respiratory failure secondary to pneumonia, HCAP - Patient is from a SNF. He was diagnosed with pneumonia on previous ED visit and was increasingly more altered and mildly hypoxic - high Concerns for possible aspiration - initially treated with antibiotics and just marginal response -goals of care meeting dictated transition to full comfort care and symptomatic management  - SLP eval done, dys III diet recommended. Now comfort feeding  - will discharge to [facility] place for full comfort  care  Alzheimer's Dementia with behavioral disturbance, Delirium - History of dementia but likely made worse secondary to acute infection - Patient has been combative and has required the use of restraints - Will continue ativan - Plan is for comfort measures now - Roxanol added   Dehydration in the setting of poor oral intake and PNA - elevated BUN, and pre-renal acute kidney injury - resolved with IVF -palliative care discussed with family and decisions made for comfort care and no artificial interventions.  History of psychosis -continue Seroquel, Haldol and Depakote  LLQ pain  - with soft stools - has been on imodium in the past - CT abd with no acute finding to explain the pain  - appears comfortable now -plan now is for full comfort care  Compression fractures -Seen on chest x-ray -will provide symptomatic treatment -comfortable today  Hypokalemia -mild -resolved with supplementation.  Leukopenia -unclear etiology; most likely due to infection -after discussing with palliative care; decision was made for comfort measures    Severe PCM, underweight  - Body mass index is 16.69 kg/m. -patient with very poor oral intake and at risk for aspiration -now just comfort care anticipated.  Procedures:  See below for x-ray reports   Consultations:  Palliative Care  Discharge Exam: Vitals:   05/14/16 0438 05/14/16 1401  BP: (!) 147/79 (!) 133/54  Pulse: 96   Resp: 18 18  Temp: 98.7 F (37.1 C) 97.5 F (36.4 C)   General exam: Appears calm and comfortable, no agitations and no distress.  Respiratory system: Respiratory effort normal. Rhonchi appreciated Cardiovascular system: S1 & S2 heard, RRR. No JVD, rubs, gallops or clicks. No pedal edema. Gastrointestinal system: Abdomen is nondistended,  soft and nontender. No organomegaly or masses felt.    Discharge Instructions  Discharge Instructions    Discharge instructions    Complete by:  As directed     Comfort feeding Symptomatic management, end of life care and comfort measures     Current Discharge Medication List    START taking these medications   Details  Morphine Sulfate (MORPHINE CONCENTRATE) 10 mg / 0.5 ml concentrated solution Take 0.5 mLs (10 mg total) by mouth every 4 (four) hours as needed for severe pain or shortness of breath (agitation and Comfort).      CONTINUE these medications which have CHANGED   Details  LORazepam (ATIVAN) 0.5 MG tablet Take 1-2 tablets (0.5-1 mg total) by mouth every 4 (four) hours as needed for anxiety (aggitation).      CONTINUE these medications which have NOT CHANGED   Details  acetaminophen (TYLENOL) 500 MG tablet Take 500 mg by mouth 2 (two) times daily as needed for moderate pain.    divalproex (DEPAKOTE ER) 500 MG 24 hr tablet Take 500 mg by mouth daily with breakfast.     Glycerin-Hypromellose-PEG 400 (VISINE PURE TEARS) 0.2-0.2-1 % SOLN Place 1 drop into both eyes 2 (two) times daily.    latanoprost (XALATAN) 0.005 % ophthalmic solution Place 1 drop into both eyes at bedtime.    loperamide (IMODIUM) 2 MG capsule Take 2 mg by mouth every 6 (six) hours as needed for diarrhea or loose stools.     loratadine (CLARITIN) 10 MG tablet Take 10 mg by mouth every morning.    mirtazapine (REMERON) 30 MG tablet Take 30 mg by mouth at bedtime.    QUEtiapine (SEROQUEL) 50 MG tablet Take 50-100 mg by mouth 2 (two) times daily. 50mg  in the morning and 100 mg at bedtime    selenium sulfide (SELSUN) 1 % LOTN Apply 1 application topically 3 (three) times a week. Uses on Tuesday, Thursday, and Friday    senna-docusate (SENOKOT-S) 8.6-50 MG tablet Take 1 tablet by mouth daily with breakfast.     haloperidol (HALDOL) 2 MG tablet Take 1 tablet (2 mg total) by mouth 2 (two) times daily. Qty: 30 tablet, Refills: 0      STOP taking these medications     aspirin 81 MG chewable tablet      azithromycin (ZITHROMAX) 250 MG tablet      ENSURE  (ENSURE)      Multiple Vitamin (MULTIVITAMIN WITH MINERALS) TABS tablet      pravastatin (PRAVACHOL) 10 MG tablet      Skin Protectants, Misc. (BAZA PROTECT EX)      Skin Protectants, Misc. (MINERIN) CREA      traMADol (ULTRAM) 50 MG tablet        Allergies  Allergen Reactions  . Sulfa Antibiotics Rash    The results of significant diagnostics from this hospitalization (including imaging, microbiology, ancillary and laboratory) are listed below for reference.    Significant Diagnostic Studies: Dg Chest 2 View  Result Date: 05/09/2016 CLINICAL DATA:  Altered mental status, cough, recent pneumonia EXAM: CHEST  2 VIEW COMPARISON:  05/08/2016 FINDINGS: Persistent streaky infiltrate/ pneumonia right lower lobe infrahilar. New streaky infiltrate or atelectasis left lower lobe. No pulmonary edema. Degenerative changes thoracic spine. There are compression deformities lower thoracic spine of indeterminate age. Clinical correlation is necessary. IMPRESSION: Persistent streaky infiltrate/ pneumonia right lower lobe infrahilar. New streaky infiltrate or atelectasis left lower lobe. Followup PA and lateral chest X-ray is recommended in 3-4 weeks following  trial of antibiotic therapy to ensure resolution and exclude underlying malignancy. Compression deformities lower thoracic spine of indeterminate age. Clinical correlation is necessary. Electronically Signed   By: Natasha MeadLiviu  Pop M.D.   On: 05/09/2016 09:35   Dg Chest 2 View  Result Date: 05/08/2016 CLINICAL DATA:  Unwitnessed fall at nursing home cough EXAM: CHEST  2 VIEW COMPARISON:  06/22/2014, FINDINGS: Mildly low lung volumes. Consolidative opacity at the right middle lobe and right lung base. No effusion. Heart size normal. Atherosclerosis. No pneumothorax. IMPRESSION: Focal opacity at the right infrahilar lung, could reflect pneumonia although a mass is not excluded. Radiographic follow-up to resolution recommended, if no resolution, CT  follow-up would be advised. Electronically Signed   By: Jasmine PangKim  Fujinaga M.D.   On: 05/08/2016 02:02   Ct Head Wo Contrast  Result Date: 05/08/2016 CLINICAL DATA:  Unwitnessed fall injury to top of right side of head dementia EXAM: CT HEAD WITHOUT CONTRAST CT CERVICAL SPINE WITHOUT CONTRAST TECHNIQUE: Multidetector CT imaging of the head and cervical spine was performed following the standard protocol without intravenous contrast. Multiplanar CT image reconstructions of the cervical spine were also generated. COMPARISON:  10/04/2005 FINDINGS: CT HEAD FINDINGS Brain: No acute territorial infarction, intracranial hemorrhage or focal mass lesion is visualized. There is no midline shift. Mild to moderate ventricular enlargement, increased compared to prior but likely related to atrophy. There is moderate atrophy which has progressed since the previous exam. There are mild white matter hypodensities, felt consistent with small vessel disease. Vascular: Carotid artery calcifications. Mild increased density in the region of left MCA on axial views but on coronal views, there appears to be mild dense linear artifact in this region. Skull: Mastoid air cells are clear.  There is no skull fracture. Sinuses/Orbits: Mucosal thickening and retention cysts in the maxillary, ethmoid and frontal sinuses. Old appearing fracture of the medial wall of the right orbit. No acute orbital abnormality. Other: None CT CERVICAL SPINE FINDINGS Alignment: No subluxation is seen. Facet alignment maintained. Appearance at C1-C2 is thought related to head positioning. Skull base and vertebrae: Craniovertebral junction appears intact. Vertebral body heights are maintained. There is no fracture identified. Soft tissues and spinal canal: No prevertebral fluid or swelling. No visible canal hematoma. Disc levels: Moderate narrowing, endplate changes and osteophyte at C5-C6 and C6-C7. Mild to moderate canal stenosis at C5-C6 and C6-C7. Posterior disc  osteophyte complex at C6-C7. Multilevel hyper trophic facet arthropathy results in multilevel foraminal stenosis. Upper chest: Emphysematous disease in the apices. Other: Thyroid gland is within normal limits. Carotid artery calcifications. IMPRESSION: 1. No definite CT evidence for acute intracranial abnormality. Mild periventricular white matter small vessel disease. Progression of atrophy. 2. No definite CT evidence for acute fracture or malalignment of the cervical spine. 3. Emphysematous disease in the apices. Electronically Signed   By: Jasmine PangKim  Fujinaga M.D.   On: 05/08/2016 02:38   Ct Cervical Spine Wo Contrast  Result Date: 05/08/2016 CLINICAL DATA:  Unwitnessed fall injury to top of right side of head dementia EXAM: CT HEAD WITHOUT CONTRAST CT CERVICAL SPINE WITHOUT CONTRAST TECHNIQUE: Multidetector CT imaging of the head and cervical spine was performed following the standard protocol without intravenous contrast. Multiplanar CT image reconstructions of the cervical spine were also generated. COMPARISON:  10/04/2005 FINDINGS: CT HEAD FINDINGS Brain: No acute territorial infarction, intracranial hemorrhage or focal mass lesion is visualized. There is no midline shift. Mild to moderate ventricular enlargement, increased compared to prior but likely related to atrophy. There is  moderate atrophy which has progressed since the previous exam. There are mild white matter hypodensities, felt consistent with small vessel disease. Vascular: Carotid artery calcifications. Mild increased density in the region of left MCA on axial views but on coronal views, there appears to be mild dense linear artifact in this region. Skull: Mastoid air cells are clear.  There is no skull fracture. Sinuses/Orbits: Mucosal thickening and retention cysts in the maxillary, ethmoid and frontal sinuses. Old appearing fracture of the medial wall of the right orbit. No acute orbital abnormality. Other: None CT CERVICAL SPINE FINDINGS  Alignment: No subluxation is seen. Facet alignment maintained. Appearance at C1-C2 is thought related to head positioning. Skull base and vertebrae: Craniovertebral junction appears intact. Vertebral body heights are maintained. There is no fracture identified. Soft tissues and spinal canal: No prevertebral fluid or swelling. No visible canal hematoma. Disc levels: Moderate narrowing, endplate changes and osteophyte at C5-C6 and C6-C7. Mild to moderate canal stenosis at C5-C6 and C6-C7. Posterior disc osteophyte complex at C6-C7. Multilevel hyper trophic facet arthropathy results in multilevel foraminal stenosis. Upper chest: Emphysematous disease in the apices. Other: Thyroid gland is within normal limits. Carotid artery calcifications. IMPRESSION: 1. No definite CT evidence for acute intracranial abnormality. Mild periventricular white matter small vessel disease. Progression of atrophy. 2. No definite CT evidence for acute fracture or malalignment of the cervical spine. 3. Emphysematous disease in the apices. Electronically Signed   By: Jasmine Pang M.D.   On: 05/08/2016 02:38   Ct Abdomen Pelvis W Contrast  Result Date: 05/11/2016 CLINICAL DATA:  82 year old male with history of psychotic disorder. Presents with combativeness. Shortness of breath and coughing. Being treated for pneumonia. EXAM: CT ABDOMEN AND PELVIS WITH CONTRAST TECHNIQUE: Multidetector CT imaging of the abdomen and pelvis was performed using the standard protocol following bolus administration of intravenous contrast. CONTRAST:  ISOVUE-300 IOPAMIDOL (ISOVUE-300) INJECTION 61% COMPARISON:  September 14, 2015 FINDINGS: Lower chest: Mild persistent infiltrate in the right lung base. Atelectasis in the lingula and right middle lobe. No other abnormalities in the lung bases. Gynecomastia, left greater than right. Hepatobiliary: No focal liver abnormality is seen. No gallstones, gallbladder wall thickening, or biliary dilatation. Pancreas:  Unremarkable. No pancreatic ductal dilatation or surrounding inflammatory changes. Spleen: Normal in size without focal abnormality. Adrenals/Urinary Tract: Adrenal glands are unremarkable. Kidneys are normal, without renal calculi, focal lesion, or hydronephrosis. Bladder is unremarkable. Stomach/Bowel: The stomach and small bowel are normal. A moderate size stool ball is seen in the rectum measuring 7.8 by 5.3 cm. No adjacent stranding or wall thickening. The colon is otherwise normal. Visualized portions of the appendix are normal. Vascular/Lymphatic: An aorta bifemoral graft is identified. Just above the graft, the aorta measures 3.1 by 3.1 cm on today's study versus 3.1 x 3.1 cm previously. The celiac and superior mesenteric arteries arise above the level of the graft. The renal arteries also arise just above the level the graft. No evidence of failure. The vessels are otherwise stable. No adenopathy. Reproductive: Prostate is unremarkable. Other: Hernia mesh is seen in the anterior abdomen wall. No evidence of failure. Musculoskeletal: Scalloping in the inferior endplate of L1 is stable. Anterior wedging of T12 has significantly worsened in the interval with approximately 50% loss of anterior height on this study. There is also increasing wedging of T11 in the interval with approximately 30% loss of anterior height. IMPRESSION: 1. Increased anterior wedging of T11 and T12 since April 2017. There is no apparent adjacent soft tissue  edema on this study. Recommend clinical correlation in this region for acute pain. 2. Persistent infiltrate in the right lung base. 3. Stable repair of the abdominal aorta. 4. No other interval changes. These results will be called to the ordering clinician or representative by the Radiologist Assistant, and communication documented in the PACS or zVision Dashboard. Electronically Signed   By: Gerome Samavid  Williams III M.D   On: 05/11/2016 20:20    Microbiology: Recent Results (from  the past 240 hour(s))  Blood culture (routine x 2)     Status: None (Preliminary result)   Collection Time: 05/09/16 12:52 PM  Result Value Ref Range Status   Specimen Description BLOOD RIGHT ANTECUBITAL  Final   Special Requests BOTTLES DRAWN AEROBIC AND ANAEROBIC 5CC  Final   Culture   Final    NO GROWTH 4 DAYS Performed at [facility]    Report Status PENDING  Incomplete  Blood culture (routine x 2)     Status: None (Preliminary result)   Collection Time: 05/09/16 12:58 PM  Result Value Ref Range Status   Specimen Description BLOOD LEFT ANTECUBITAL  Final   Special Requests BOTTLES DRAWN AEROBIC AND ANAEROBIC 5CC  Final   Culture   Final    NO GROWTH 4 DAYS Performed at [facility]     Report Status PENDING  Incomplete  MRSA PCR Screening     Status: None   Collection Time: 05/09/16  6:15 PM  Result Value Ref Range Status   MRSA by PCR NEGATIVE NEGATIVE Final    Comment:        The GeneXpert MRSA Assay (FDA approved for NASAL specimens only), is one component of a comprehensive MRSA colonization surveillance program. It is not intended to diagnose MRSA infection nor to guide or monitor treatment for MRSA infections.      Labs: Basic Metabolic Panel:  Recent Labs Lab 05/10/16 0610 05/11/16 0510 05/12/16 0606 05/13/16 0555 05/14/16 0512  NA 139 138 138 140 140  K 3.4* 4.5 3.6 3.3* 3.5  CL 107 104 103 103 103  CO2 27 22 27 29 29   GLUCOSE 115* 110* 117* 113* 105*  BUN 13 8 8 10 15   CREATININE 0.69 0.73 0.80 0.88 0.92  CALCIUM 8.3* 8.5* 8.4* 8.7* 8.8*   Liver Function Tests:  Recent Labs Lab 05/09/16 0935  AST 20  ALT 16*  ALKPHOS 60  BILITOT 0.7  PROT 7.1  ALBUMIN 3.6    Recent Labs Lab 05/09/16 1637  AMMONIA 10   CBC:  Recent Labs Lab 05/09/16 0935  05/10/16 0610 05/11/16 0510 05/12/16 0606 05/13/16 0555 05/14/16 0512  WBC 4.8  < > 3.9* 5.3 3.7* 2.7* 4.1  NEUTROABS 3.4  --   --   --   --   --   --   HGB 11.5*  < >  11.7* 13.2 12.1* 12.0* 12.1*  HCT 35.2*  < > 34.7* 38.9* 35.3* 35.7* 36.5*  MCV 95.1  < > 93.0 91.5 88.9 92.7 92.6  PLT 192  < > 218 257 224 218 240  < > = values in this interval not displayed.   Cardiac Enzymes:  Recent Labs Lab 05/09/16 1637  TROPONINI <0.03    Signed:  Vassie LollMadera, Dainel Arcidiacono MD.  [facility] 05/14/2016, 2:26 PM

## 2016-05-14 NOTE — Progress Notes (Signed)
LCSWA will assist with transport to [facility].  Fax sent to facility. DNR placed in transport packet [facility] called for transport. Scheduled for 4:00pm Pt. Daughter Riley DikeJennifer notified.   Vivi BarrackNicole Essance Gatti, Theresia MajorsLCSWA, MSW Clinical Social Worker 5E and Psychiatric Service Line [phone] 05/14/2016  3:16 PM

## 2016-05-14 NOTE — Progress Notes (Signed)
Nutrition Brief Note  82 y.o.malewith medical history significant of psychotic disorder, dementia, anxiety, hyperlipidemia. Patient presented after a recent history of being combative. Per family, this is a big step off of baseline. He was recently seen (yesterday) for dyspnea and coughing and was being treated for a pneumonia. He was treated with azithromycin but he did not seem to be improving.   Chart reviewed and discussed with RN. Patient will be discharging today to [facility]  Patient with poor prognosis < 4 weeks.  No nutrition interventions warranted at this time. Please re-consult Dietitian as needed.   Riley RimaLeanne Rukaya Kleinschmidt, MS, RD, LDN Pager: [phone] After Hours Pager: [phone]

## 2016-05-14 NOTE — Consult Note (Signed)
[place] Liaison  Met with patient's spouse and two daughters to complete paper work for transfer to [facility] today. Dr. Orpah Melter to assume care of patient per family request. CSW Elmyra Ricks aware.   Please fax discharge summary to [phone].  RN please call report to [phone].  Thank you,  Erling Conte, LCSW [phone]

## 2016-05-14 NOTE — Care Management Important Message (Signed)
Important Message  Patient Details  Name: Vern ClaudeRalph A Mullaly MRN: [id number] Date of Birth: Aug 01, 1933   Medicare Important Message Given:  Yes    Haskell FlirtJamison, Jovan Schickling 05/14/2016, 10:23 AMImportant Message  Patient Details  Name: Vern ClaudeRalph A Osinski MRN: [id number] Date of Birth: Aug 01, 1933   Medicare Important Message Given:  Yes    Haskell FlirtJamison, Myquan [place]umb[place] 05/14/2016, 10:23 AM

## 2016-05-14 NOTE — Progress Notes (Signed)
Physical Therapy Treatment Patient Details Name: [name] MRN: [id number] DOB: 1933/10/08 Today's Date: 05/14/2016    History of Present Illness [name] is Day 82 y.o. male with medical history significant of psychotic disorder, dementia, anxiety, hyperlipidemia. Patient presents after Day recent history of being combative. He was recently  being treated for Day pneumonia.  He has been getting more aggressive and using expletives    PT Comments    Pt pleasant today and agreeable to mobilize however once attempting, he reports back pain.  Pt assisted to sitting position however was unable to stand.  D/C plan per chart, residential hospice vs back to ALF.  Pt will need assist for mobility upon d/c.   Follow Up Recommendations  SNF;Supervision/Assistance - 24 hour     Equipment Recommendations  None recommended by PT    Recommendations for Other Services       Precautions / Restrictions Precautions Precautions: Fall Precaution Comments: can be combative    Mobility  Bed Mobility Overal bed mobility: Needs Assistance Bed Mobility: Sit to Sidelying;Supine to Sit     Supine to sit: Max assist;HOB elevated   Sit to sidelying: Mod assist General bed mobility comments: multiple attempts to assist pt to EOB however pt reported back pain, repositioned and pt able to assist better once LEs over EOB, assist for LEs onto bed, returned to sidelying due to back pain  Transfers                 General transfer comment: placed RW in front of pt and attempted however pt not assisting, reports back pain  Ambulation/Gait                 Stairs            Wheelchair Mobility    Modified Rankin (Stroke Patients Only)       Balance Overall balance assessment: Needs assistance Sitting-balance support: Feet supported;No upper extremity supported Sitting balance-Leahy Scale: Fair                              Cognition Arousal/Alertness:  Awake/alert Behavior During Therapy: WFL for tasks assessed/performed Overall Cognitive Status: History of cognitive impairments - at baseline                 General Comments: pt pleasant and cooperative today    Exercises      General Comments        Pertinent Vitals/Pain Pain Assessment: Faces Faces Pain Scale: Hurts little more Pain Location: back Pain Descriptors / Indicators: Grimacing Pain Intervention(s): Monitored during session;Repositioned    Home Living                      Prior Function            PT Goals (current goals can now be found in the care plan section) Progress towards PT goals: Progressing toward goals    Frequency    Min 2X/week      PT Plan Current plan remains appropriate    Co-evaluation             End of Session Equipment Utilized During Treatment: Oxygen Activity Tolerance: Patient limited by pain Patient left: in bed;with call bell/phone within reach;with bed alarm set     Time: 4098-11911012-1031 PT Time Calculation (min) (ACUTE ONLY): 19 min  Charges:  $Therapeutic Activity: 8-22 mins  G Codes:      [name],KATHrine E 05/14/2016, 12:01 PM Zenovia JarredKati Alisia Vanengen, PT, DPT 05/14/2016 Pager: [phone]

## 2016-05-14 NOTE — Progress Notes (Signed)
Gave report to [facility] at [facility]. Left number in case she had additional questions.

## 2016-06-11 DEATH — deceased
